# Patient Record
Sex: Female | Born: 1985 | Race: Asian | Hispanic: No | Marital: Married | Smoking: Never smoker
Health system: Southern US, Community
[De-identification: ages and names within clinical notes are randomized; demographics above are authoritative.]

## PROBLEM LIST (undated history)

## (undated) DIAGNOSIS — Z862 Personal history of diseases of the blood and blood-forming organs and certain disorders involving the immune mechanism: Secondary | ICD-10-CM

## (undated) DIAGNOSIS — O00109 Unspecified tubal pregnancy without intrauterine pregnancy: Secondary | ICD-10-CM

## (undated) DIAGNOSIS — Z9889 Other specified postprocedural states: Secondary | ICD-10-CM

## (undated) DIAGNOSIS — Z9049 Acquired absence of other specified parts of digestive tract: Secondary | ICD-10-CM

## (undated) HISTORY — PX: INTRAUTERINE DEVICE (IUD) INSERTION: SHX5877

## (undated) HISTORY — PX: APPENDECTOMY: SHX54

## (undated) HISTORY — DX: Unspecified tubal pregnancy without intrauterine pregnancy: O00.109

## (undated) HISTORY — PX: WISDOM TOOTH EXTRACTION: SHX21

---
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
## 2005-02-12 ENCOUNTER — Emergency Department (HOSPITAL_COMMUNITY): Admission: EM | Admit: 2005-02-12 | Discharge: 2005-02-12 | Payer: Self-pay | Admitting: Family Medicine

## 2007-02-19 ENCOUNTER — Ambulatory Visit: Payer: Self-pay | Admitting: *Deleted

## 2007-02-19 ENCOUNTER — Inpatient Hospital Stay (HOSPITAL_COMMUNITY): Admission: AD | Admit: 2007-02-19 | Discharge: 2007-02-21 | Payer: Self-pay | Admitting: Obstetrics & Gynecology

## 2008-05-08 DIAGNOSIS — Z9889 Other specified postprocedural states: Secondary | ICD-10-CM

## 2008-05-08 HISTORY — DX: Other specified postprocedural states: Z98.890

## 2009-05-08 DIAGNOSIS — O00109 Unspecified tubal pregnancy without intrauterine pregnancy: Secondary | ICD-10-CM

## 2009-05-08 HISTORY — DX: Unspecified tubal pregnancy without intrauterine pregnancy: O00.109

## 2011-02-15 LAB — CCBB MATERNAL DONOR DRAW

## 2011-02-16 LAB — CBC
MCV: 78.5
Platelets: 287
RDW: 14.8 — ABNORMAL HIGH
WBC: 10.2

## 2011-02-16 LAB — HEPATITIS B SURFACE ANTIGEN: Hepatitis B Surface Ag: NEGATIVE

## 2011-02-16 LAB — RPR: RPR Ser Ql: REACTIVE — AB

## 2011-02-16 LAB — RUBELLA SCREEN: Rubella: 51.6 — ABNORMAL HIGH

## 2013-02-05 ENCOUNTER — Encounter: Payer: Self-pay | Admitting: Obstetrics and Gynecology

## 2013-02-19 ENCOUNTER — Encounter: Payer: Self-pay | Admitting: Obstetrics and Gynecology

## 2013-02-19 ENCOUNTER — Encounter: Payer: Self-pay | Admitting: Certified Nurse Midwife

## 2013-02-20 ENCOUNTER — Encounter: Payer: Self-pay | Admitting: Nurse Practitioner

## 2013-02-20 ENCOUNTER — Ambulatory Visit (INDEPENDENT_AMBULATORY_CARE_PROVIDER_SITE_OTHER): Payer: 59 | Admitting: Nurse Practitioner

## 2013-02-20 VITALS — BP 100/58 | HR 60 | Resp 16 | Ht 64.75 in | Wt 147.0 lb

## 2013-02-20 DIAGNOSIS — Z0000 Encounter for general adult medical examination without abnormal findings: Secondary | ICD-10-CM

## 2013-02-20 DIAGNOSIS — Z01419 Encounter for gynecological examination (general) (routine) without abnormal findings: Secondary | ICD-10-CM

## 2013-02-20 DIAGNOSIS — Z975 Presence of (intrauterine) contraceptive device: Secondary | ICD-10-CM

## 2013-02-20 LAB — POCT URINALYSIS DIPSTICK
Glucose, UA: NEGATIVE
Ketones, UA: NEGATIVE
Urobilinogen, UA: NEGATIVE

## 2013-02-20 LAB — HEMOGLOBIN, FINGERSTICK: Hemoglobin, fingerstick: 13.4 g/dL (ref 12.0–16.0)

## 2013-02-20 NOTE — Patient Instructions (Signed)
Preparing for Pregnancy Preparing for pregnancy (preconceptual care) by getting counseling and information from your caregiver before getting pregnant is a good idea. It will help you and your baby have a better chance to have a healthy, safe pregnancy and delivery of your baby. Make an appointment with your caregiver to talk about your health, medical, and family history and how to prepare yourself before getting pregnant. Your caregiver will do a complete physical exam and a Pap test. They will want to know:  About you, your spouse or partner, and your family's medical and genetic history.  If you are eating a balanced diet and drinking enough fluids.  What vitamins and mineral supplements you are taking. This includes taking folic acid before getting pregnant to help prevent birth defects.  What medications you are taking including prescription, over-the-counter and herbal medications.  If there is any substance abuse like alcohol, smoking, and illegal drugs.  If there is any mental or physical domestic violence.  If there is any risk of sexually transmitted disease between you and your partner.  What immunizations and vaccinations you have had and what you may need before getting pregnant.  If you should get tested for HIV infection.  If there is any exposure to chemical or toxic substances at home or work.  If there are medical problems you have that need to be treated and kept under control before getting pregnant such as diabetes, high blood pressure or others.  If there were any past surgeries, pregnancies and problems with them.  What your current weight is and to set a goal as to how much weight you should gain while pregnant. Also, they will check if you should lose or gain weight before getting pregnant.  What is your exercise routine and what it is safe when you are pregnant.  If there are any physical disabilities that need to be addressed.  About spacing your  pregnancies when there are other children.  If there is a financial problem that may affect you having a child. After talking about the above points with your caregiver, your caregiver will give you advice on how to help treat and work with you on solving any issues, if necessary, before getting pregnant. The goal is to have a healthy and safe pregnancy for you and your baby. You should keep an accurate record of your menstrual periods because it will help in determining your due date. Immunizations that you should have before getting pregnant:   Regular measles, German measles (rubella) and mumps.  Tetanus and diphtheria.  Chickenpox, if not immune.  Herpes zoster (Varicella) if not immune.  Human papilloma virus vaccine (HPV) between the age of 9 and 26 years old.  Hepatitis A vaccine.  Hepatitis B vaccine.  Influenza vaccine.  Pneumococcal vaccine (pneumonia). You should avoid getting pregnant for one month after getting vaccinated with a live virus vaccine such as German measles (rubella) vaccine. Other immunizations may be necessary depending on where you live, such as malaria. Ask your caregiver if any other immunizations are needed for you. HOME CARE INSTRUCTIONS   Follow the advice of your caregiver.  Before getting pregnant:  Begin taking vitamins, supplements, and 0.4 milligrams folic acid daily.  Get your immunizations up-to-date.  Get help from a nutrition counselor if you do not understand what a balanced diet is, need help with a special medical diet or if you need help to lose or gain weight.  Begin exercising.  Stop smoking, taking illegal drugs,   and drinking alcoholic beverages.  Get counseling if there is and type of domestic violence.  Get checked for sexually transmitted diseases including HIV.  Get any medical problems under control (diabetes, high blood pressure, convulsions, asthma or others).  Resolve any financial concerns.  Be sure you and  your spouse or partner are ready to have a baby.  Keep an accurate record of your menstrual periods. Document Released: 04/06/2008 Document Revised: 07/17/2011 Document Reviewed: 04/06/2008 [employer] Patient Information 2014 [employer].  

## 2013-02-20 NOTE — Progress Notes (Signed)
Patient ID: Melissa Freeman, female   DOB: 03-01-1986, 27 y.o.   MRN: [id number] 27 y.o. G3P1021 Married Asian Fe here for annual exam.  [facility] [place]  Wants to remove IUD to try for another pregnancy.  Since insertion either no menses or spotting.  Menses normal.  Has 2 pregnancies.  Patient's last menstrual period was 01/29/2013.          Sexually active: yes  The current method of family planning is IUD.   Mirena Exercising: yes  The patient does not participate in regular exercise at present.  Hard to find time with work.  Light workout at home. Smoker:  no  Health Maintenance: Pap:  4-5 years ago, never had abnormal pap TDaP:  ?  Labs: HB: 13.4 Urine: trace RBC, trace WBC, pH 8   reports that she has never smoked. She has never used smokeless tobacco. She reports that she drinks alcohol. She reports that she does not use illicit drugs.  History reviewed. No pertinent past medical history.  Past Surgical History  Procedure Laterality Date  . Appendectomy  age 73    No current outpatient prescriptions on file.   No current facility-administered medications for this visit.    Family History  Problem Relation Age of Onset  . Hyperlipidemia Mother   . Hyperlipidemia Father   . Stroke Maternal Grandfather     ROS:  Pertinent items are noted in HPI.  Otherwise, a comprehensive ROS was negative.  Exam:   BP 100/58  Pulse 60  Resp 16  Ht 5' 4.75" (1.645 m)  Wt 147 lb (66.679 kg)  BMI 24.64 kg/m2  LMP 01/29/2013 Height: 5' 4.75" (164.5 cm)  Ht Readings from Last 3 Encounters:  02/20/13 5' 4.75" (1.645 m)    General appearance: alert, cooperative and appears stated age Head: Normocephalic, without obvious abnormality, atraumatic Neck: no adenopathy, supple, symmetrical, trachea midline and thyroid normal to inspection and palpation Lungs: clear to auscultation bilaterally Breasts: normal appearance, no masses or  tenderness Heart: regular rate and rhythm Abdomen: soft, non-tender; no masses,  no organomegaly Extremities: extremities normal, atraumatic, no cyanosis or edema Skin: Skin color, texture, turgor normal. No rashes or lesions Lymph nodes: Cervical, supraclavicular, and axillary nodes normal. No abnormal inguinal nodes palpated Neurologic: Grossly normal   Pelvic: External genitalia:  no lesions              Urethra:  normal appearing urethra with no masses, tenderness or lesions              Bartholin's and Skene's: normal                 Vagina: normal appearing vagina with normal color and discharge, no lesions              Cervix: anteverted with short IUD strings visible.              Pap taken: yes Bimanual Exam:  Uterus:  normal size, contour, position, consistency, mobility, non-tender              Adnexa: no mass, fullness, tenderness               Rectovaginal: Confirms               Anus:  normal sphincter tone, no lesions  A:  Well Woman with normal exam  Mirena IUD 02/06/11 and wants removal  Planning another pregnancy  P:  Pap smear as per guidelines Done today  Information to insurance for IUD removal  Information is given to restart prenatal MVI now.  With the IUD being removed will use BUM of birth control for 1-2 months for menses to return to normal. She is given basic information about prenatal care and list of safe OTC med's.  Counseled on breast self exam, adequate intake of calcium and vitamin D, diet and exercise return annually or prn  An After Visit Summary was printed and given to the patient.

## 2013-02-21 LAB — IPS PAP TEST WITH REFLEX TO HPV

## 2013-02-22 NOTE — Progress Notes (Signed)
[place] reviewed by Dr. Conley Simmonds.

## 2013-02-25 ENCOUNTER — Telehealth: Payer: Self-pay | Admitting: Nurse Practitioner

## 2013-02-25 NOTE — Telephone Encounter (Signed)
LMTCB to discuss ins benefits and to schedule IUD removal.

## 2013-03-12 ENCOUNTER — Telehealth: Payer: Self-pay | Admitting: Nurse Practitioner

## 2013-03-12 NOTE — Telephone Encounter (Signed)
Advised pap/urine and hgb all wnl. Patient has appt for 11/13 for iud removal.

## 2013-03-12 NOTE — Telephone Encounter (Signed)
Patient calling for recent results please?

## 2013-03-20 ENCOUNTER — Telehealth: Payer: Self-pay | Admitting: Obstetrics and Gynecology

## 2013-03-20 ENCOUNTER — Ambulatory Visit: Payer: 59 | Admitting: Obstetrics and Gynecology

## 2013-03-20 NOTE — Telephone Encounter (Signed)
Spoke with patient to reschedule her appointment that she was 1hour late for  this morning, I offered her today at 12:45   pt was already at work, she was still a little upset and said she would have to think about it. If she decides to reschedule will call back.

## 2013-03-20 NOTE — Telephone Encounter (Signed)
Case reviewed with Patty and Dr. Hyacinth Meeker.  Patient will be charged for [facility] appointment as she came one hour late.

## 2013-03-20 NOTE — Telephone Encounter (Signed)
Yes should get charged fee. --- but if that would cause her to be even more upset and not return then we should not.  It is your judgment call since you spoke to her.

## 2013-03-20 NOTE — Telephone Encounter (Signed)
Pt was an hour late for appointment. Do you want to charge dnka fee?

## 2013-03-20 NOTE — Telephone Encounter (Signed)
Pt came in for procedure but was an hour late. Would like nurse to call and reschedule her appointment.

## 2013-04-30 ENCOUNTER — Telehealth: Payer: Self-pay | Admitting: Obstetrics and Gynecology

## 2013-04-30 NOTE — Telephone Encounter (Signed)
Call to patient.  States she was schededuled for IUD removal one month ago but had to cancel due to menses and she was uncomfortable having it done with cycle.  Would like to reschedule for next week to have removed.  appt scheduled for 05-05-13 at 10 with Dr Edward Jolly. Routing to provider for final review. Patient agreeable to disposition. Will close encounter

## 2013-04-30 NOTE — Telephone Encounter (Signed)
Pt wants to schedule an appointment for IUD removal. °

## 2013-05-05 ENCOUNTER — Ambulatory Visit (INDEPENDENT_AMBULATORY_CARE_PROVIDER_SITE_OTHER): Payer: 59 | Admitting: Obstetrics and Gynecology

## 2013-05-05 ENCOUNTER — Encounter: Payer: Self-pay | Admitting: Obstetrics and Gynecology

## 2013-05-05 VITALS — BP 94/64 | HR 68 | Resp 16 | Ht 64.75 in | Wt 152.0 lb

## 2013-05-05 DIAGNOSIS — Z975 Presence of (intrauterine) contraceptive device: Secondary | ICD-10-CM

## 2013-05-05 DIAGNOSIS — Z3009 Encounter for other general counseling and advice on contraception: Secondary | ICD-10-CM

## 2013-05-05 DIAGNOSIS — Z8349 Family history of other endocrine, nutritional and metabolic diseases: Secondary | ICD-10-CM

## 2013-05-05 DIAGNOSIS — Z30432 Encounter for removal of intrauterine contraceptive device: Secondary | ICD-10-CM

## 2013-05-05 DIAGNOSIS — Z83438 Family history of other disorder of lipoprotein metabolism and other lipidemia: Secondary | ICD-10-CM

## 2013-05-05 NOTE — Patient Instructions (Signed)
Prenatal Care  WHAT IS PRENATAL CARE?  Prenatal care means health care during your pregnancy, before your baby is born. Take care of yourself and your baby by:   Getting early prenatal care. If you know you are pregnant, or think you might be pregnant, call your caregiver as soon as possible. Schedule a visit for a general/prenatal examination.  Getting regular prenatal care. Follow your caregiver's schedule for blood and other necessary tests. Do not miss appointments.  Do everything you can to keep yourself and your baby healthy during your pregnancy.  Prenatal care should include evaluation of medical, dietary, educational, psychological, and social needs for the couple and the medical, surgical, and genetic history of the family of the mother and father.  Discuss with your caregiver:  Your medicines, prescription, over-the-counter, and herbal medicines.  Substance abuse, alcohol, smoking, and illegal drugs.  Domestic abuse and violence, if present.  Your immunizations.  Nutrition and diet.  Exercising.  Environment and occupational hazards, at home and at work.  History of sexually transmitted disease, both you and your partner.  Previous pregnancies. WHY IS PRENATAL CARE SO IMPORTANT?  By seeing you regularly, your caregiver has the chance to find problems early, so that they can be treated as soon as possible. Other problems might be prevented. Many studies have shown that early and regular prenatal care is important for the health of both mothers and their babies.  I AM THINKING ABOUT GETTING PREGNANT. HOW CAN I TAKE CARE OF MYSELF?  Taking care of yourself before you get pregnant helps you to have a healthy pregnancy. It also lowers your chances of having a baby born with a birth defect. Here are ways to take care of yourself before you get pregnant:   Eat healthy foods, exercise regularly (30 minutes per day for most days of the week is best), and get enough rest and  sleep. Talk to your caregiver about what kinds of foods and exercises are best for you.  Take 400 micrograms (mcg) of folic acid (one of the B vitamins) every day. The best way to do this is to take a daily multivitamin pill that contains this amount of folic acid. Getting enough of the synthetic (manufactured) form of folic acid every day before you get pregnant and during early pregnancy can help prevent certain birth defects. Many breakfast cereals and other grain products have folic acid added to them, but only certain cereals contain 400 mcg of folic acid per serving. Check the label on your multivitamin or cereal to find the amount of folic acid in the food.  See your caregiver for a complete check up before getting pregnant. Make sure that you have had all your immunization shots, especially for rubella (German measles). Rubella can cause serious birth defects. Chickenpox is another illness you want to avoid during pregnancy. If you have had chickenpox and rubella in the past, you should be immune to them.  Tell your caregiver about any prescription or non-prescription medicines (including herbal remedies) you are taking. Some medicines are not safe to take during pregnancy.  Stop smoking cigarettes, drinking alcohol, or taking illegal drugs. Ask your caregiver for help, if you need it. You can also get help with alcohol and drugs by talking with a member of your faith community, a counselor, or a trusted friend.  Discuss and treat any medical, social, or psychological problems before getting pregnant.  Discuss any history of genetic problems in the mother, father, and their families. Do   genetic testing before getting pregnant, when possible.  Discuss any physical or emotional abuse with your caregiver.  Discuss with your caregiver if you might be exposed to harmful chemicals on your job or where you live.  Discuss with your caregiver if you think your job or the hours you work may be  harmful and should be changed.  The father should be involved with the decision making and with all aspects of the pregnancy, labor, and delivery.  If you have medical insurance, make sure you are covered for pregnancy. I JUST FOUND OUT THAT I AM PREGNANT. HOW CAN I TAKE CARE OF MYSELF?  Here are ways to take care of yourself and the precious new life growing inside you:   Continue taking your multivitamin with 400 micrograms (mcg) of folic acid every day.  Get early and regular prenatal care. It does not matter if this is your first pregnancy or if you already have children. It is very important to see a caregiver during your pregnancy. Your caregiver will check at each visit to make sure that you and the baby are healthy. If there are any problems, action can be taken right away to help you and the baby.  Eat a healthy diet that includes:  Fruits.  Vegetables.  Foods low in saturated fat.  Grains.  Calcium-rich foods.  Drink 6 to 8 glasses of liquids a day.  Unless your caregiver tells you not to, try to be physically active for 30 minutes, most days of the week. If you are pressed for time, you can get your activity in through 10 minute segments, three times a day.  If you smoke, drink alcohol, or use drugs, STOP. These can cause long-term damage to your baby. Talk with your caregiver about steps to take to stop smoking. Talk with a member of your faith community, a counselor, a trusted friend, or your caregiver if you are concerned about your alcohol or drug use.  Ask your caregiver before taking any medicine, even over-the-counter medicines. Some medicines are not safe to take during pregnancy.  Get plenty of rest and sleep.  Avoid hot tubs and saunas during pregnancy.  Do not have X-rays taken, unless absolutely necessary and with the recommendation of your caregiver. A lead shield can be placed on your abdomen, to protect the baby when X-rays are taken in other parts of the  body.  Do not empty the cat litter when you are pregnant. It may contain a parasite that causes an infection called toxoplasmosis, which can cause birth defects. Also, use gloves when working in garden areas used by cats.  Do not eat uncooked or undercooked cheese, meats, or fish.  Stay away from toxic chemicals like:  Insecticides.  Solvents (some cleaners or paint thinners).  Lead.  Mercury.  Sexual relations may continue until the end of the pregnancy, unless you have a medical problem or there is a problem with the pregnancy and your caregiver tells you not to.  Do not wear high heel shoes, especially during the second half of the pregnancy. You can lose your balance and fall.  Do not take long trips, unless absolutely necessary. Be sure to see your caregiver before going on the trip.  Do not sit in one position for more than 2 hours, when on a trip.  Take a copy of your medical records when going on a trip.  Know where there is a hospital in the city you are visiting, in case of an   emergency.  Most dangerous household products will have pregnancy warnings on their labels. Ask your caregiver about products if you are unsure.  Limit or eliminate your caffeine intake from coffee, tea, sodas, medicines, and chocolate.  Many women continue working through pregnancy. Staying active might help you stay healthier. If you have a question about the safety or the hours you work at your particular job, talk with your caregiver.  Get informed:  Read books.  Watch videos.  Go to childbirth classes for you and the father.  Talk with experienced moms.  Ask your caregiver about childbirth education classes for you and your partner. Classes can help you and your partner prepare for the birth of your baby.  Ask about a pediatrician (baby doctor) and methods and pain medicine for labor, delivery, and possible Cesarean delivery (C-section). I AM NOT THINKING ABOUT GETTING PREGNANT  RIGHT NOW, BUT HEARD THAT ALL WOMEN SHOULD TAKE FOLIC ACID EVERY DAY?  All women of childbearing age, with even a remote chance of getting pregnant, should try to make sure they get enough folic acid. Many pregnancies are not planned. Many women do not know they are actually pregnant early in their pregnancies, and certain birth defects happen in the very early part of pregnancy. Taking 400 micrograms (mcg) of folic acid every day will help prevent certain birth defects that happen in the early part of pregnancy. If a woman begins taking vitamin pills in the second or third month of pregnancy, it may be too late to prevent birth defects. Folic acid may also have other health benefits for women, besides preventing birth defects.  HOW OFTEN SHOULD I SEE MY CAREGIVER DURING PREGNANCY?  Your caregiver will give you a schedule for your prenatal visits. You will have visits more often as you get closer to the end of your pregnancy. An average pregnancy lasts about 40 weeks.  A typical schedule includes visiting your caregiver:   About once each month, during your first 6 months of pregnancy.  Every 2 weeks, during the next 2 months.  Weekly in the last month, until the delivery date. Your caregiver will probably want to see you more often if:  You are over 35.  Your pregnancy is high risk, because you have certain health problems or problems with the pregnancy, such as:  Diabetes.  High blood pressure.  The baby is not growing on schedule, according to the dates of the pregnancy. Your caregiver will do special tests, to make sure you and the baby are not having any serious problems. WHAT HAPPENS DURING PRENATAL VISITS?   At your first prenatal visit, your caregiver will talk to you about you and your partner's health history and your family's health history, and will do a physical exam.  On your first visit, a physical exam will include checks of your blood pressure, height and weight, and an  exam of your pelvic organs. Your caregiver will do a Pap test if you have not had one recently, and will do cultures of your cervix to make sure there is no infection.  At each visit, there will be tests of your blood, urine, blood pressure, weight, and checking the progress of the baby.  Your caregiver will be able to tell you when to expect that your baby will be born.  Each visit is also a chance for you to learn about staying healthy during pregnancy and for asking questions.  Discuss whether you will be breastfeeding.  At your later prenatal   visits, your caregiver will check how you are doing and how the baby is developing. You may have a number of tests done as your pregnancy progresses.  Ultrasound exams are often used to check on the baby's growth and health.  You may have more urine and blood tests, as well as special tests, if needed. These may include amniocentesis (examine fluid in the pregnancy sac), stress tests (check how baby responds to contractions), biophysical profile (measures fetus well-being). Your caregiver will explain the tests and why they are necessary. I AM IN MY LATE THIRTIES, AND I WANT TO HAVE A CHILD NOW. SHOULD I DO ANYTHING SPECIAL?  As you get older, there is more chance of having a medical problem (high blood pressure), pregnancy problem (preeclampsia, problems with the placenta), miscarriage, or a baby born with a birth defect. However, most women in their late thirties and early forties have healthy babies. See your caregiver on a regular basis before you get pregnant and be sure to go for exams throughout your pregnancy. Your caregiver probably will want to do some special tests to check on you and your baby's health when you are pregnant.  Women today are often delaying having children until later in life, when they are in their thirties and forties. While many women in their thirties and forties have no difficulty getting pregnant, fertility does decline  with age. For women over 40 who cannot get pregnant after 6 months of trying, it is recommended that they see their caregiver for a fertility evaluation. It is not uncommon to have trouble becoming pregnant or experience infertility (inability to become pregnant after trying for one year). If you think that you or your partner may be infertile, you can discuss this with your caregiver. He or she can recommend treatments such as drugs, surgery, or assisted reproductive technology.  Document Released: 04/27/2003 Document Revised: 07/17/2011 Document Reviewed: 10/10/2012 [employer] Patient Information 2014 [employer].  

## 2013-05-05 NOTE — Progress Notes (Signed)
Patient ID: Melissa Freeman, female   DOB: 1986/03/21, 27 y.o.   MRN: [id number]  GYNECOLOGY PROBLEM VISIT  PCP:   Referring provider:   HPI: 27 y.o.   Single  Asian  female   [phone] with Patient's last menstrual period was 03/20/2013.   here for  Mirena IUD removal. Mirena IUD placed on 02/05/10 in [place]. Planning on childbearing.   Hs several questions about prior pregnancy loss and ectopic. Has a 22 year old son. No complications.  Had an abdominal skin rash which resoled this. Baby born without problems.  One SAB at 3 months gestation.   Had an ectopic pregnancy following this, treated with methotrexate - these were in [place] at [facility].   Taking vitamins, but not prenatal vitamin.  No prior cholesterol check. Family history of elevated cholesterol.  Did not fast today.   GYNECOLOGIC HISTORY: Patient's last menstrual period was 03/20/2013.    OB History   Grav Para Term Preterm Abortions TAB SAB Ect Mult Living   3 1 1  2  2   1          Family History  Problem Relation Age of Onset  . Hyperlipidemia Mother   . Hyperlipidemia Father   . Stroke Maternal Grandfather   . Hyperlipidemia Brother     There are no active problems to display for this patient.   Past Medical History  Diagnosis Date  . Tubal pregnancy 2011    Past Surgical History  Procedure Laterality Date  . Appendectomy  age 27  . Intrauterine device (iud) insertion      mirena inserted 02-06-11? pt thinks 2011 instead    ALLERGIES: Review of patient's allergies indicates no known allergies.  Current Outpatient Prescriptions  Medication Sig Dispense Refill  . Multiple Vitamins-Minerals (MULTIVITAMIN PO) Take by mouth daily.       No current facility-administered medications for this visit.     ROS:  Pertinent items are noted in HPI.  SOCIAL HISTORY:  Married.   PHYSICAL EXAMINATION:    BP 94/64  Pulse 68  Resp 16  Ht 5' 4.75" (1.645 m)  Wt 152 lb (68.947 kg)  BMI 25.48  kg/m2  LMP 03/20/2013   Wt Readings from Last 3 Encounters:  05/05/13 152 lb (68.947 kg)  02/20/13 147 lb (66.679 kg)     Ht Readings from Last 3 Encounters:  05/05/13 5' 4.75" (1.645 m)  02/20/13 5' 4.75" (1.645 m)    General appearance: alert, cooperative and appears stated age Neurologic: Grossly normal  Pelvic: External genitalia:  no lesions              Urethra:  normal appearing urethra with no masses, tenderness or lesions              Bartholins and Skenes: normal                 Vagina: normal appearing vagina with normal color and discharge, no lesions              Cervix: normal appearance.  IUD string noted.                 Bimanual Exam:  Uterus:  uterus is normal size, shape, consistency and nontender                                      Adnexa: normal adnexa in  size, nontender and no masses                                        Procedure - Mirena IUD removal. Verbal consent for removal. Removed with ring forceps without difficulty.   ASSESSMENT  Desire for future fertility. History of SAB x 1. History of ectopic pregnancy x 1. History of one full term delivery.  Family history of hyperlipidemia.  PLAN  Counseled on prenatal care, pregnancy loss, and ectopic pregnancy. Patient understand she is at risk for a recurrent ectopic and she will come in for early pregnancy care with next pregnancy. Use condoms for the next two months and then may try for pregnancy. Switch to PNV. Return for a fasting lipid profile. Return prn pregnancy or any other concern.  25 minutes face to face time of which over 50% was spent in pregnancy.    An After Visit Summary was printed and given to the patient.

## 2013-10-30 ENCOUNTER — Encounter: Payer: Self-pay | Admitting: Obstetrics and Gynecology

## 2013-10-30 ENCOUNTER — Ambulatory Visit (INDEPENDENT_AMBULATORY_CARE_PROVIDER_SITE_OTHER): Payer: 59

## 2013-10-30 ENCOUNTER — Other Ambulatory Visit: Payer: Self-pay | Admitting: Obstetrics and Gynecology

## 2013-10-30 ENCOUNTER — Ambulatory Visit (INDEPENDENT_AMBULATORY_CARE_PROVIDER_SITE_OTHER): Payer: 59 | Admitting: Obstetrics and Gynecology

## 2013-10-30 ENCOUNTER — Telehealth: Payer: Self-pay | Admitting: Obstetrics and Gynecology

## 2013-10-30 VITALS — BP 120/76 | HR 76 | Resp 16 | Ht 59.0 in | Wt 151.8 lb

## 2013-10-30 DIAGNOSIS — N912 Amenorrhea, unspecified: Secondary | ICD-10-CM

## 2013-10-30 DIAGNOSIS — D239 Other benign neoplasm of skin, unspecified: Secondary | ICD-10-CM

## 2013-10-30 DIAGNOSIS — R229 Localized swelling, mass and lump, unspecified: Secondary | ICD-10-CM

## 2013-10-30 DIAGNOSIS — Z8742 Personal history of other diseases of the female genital tract: Secondary | ICD-10-CM

## 2013-10-30 DIAGNOSIS — Z8759 Personal history of other complications of pregnancy, childbirth and the puerperium: Secondary | ICD-10-CM

## 2013-10-30 DIAGNOSIS — M545 Low back pain, unspecified: Secondary | ICD-10-CM

## 2013-10-30 DIAGNOSIS — D229 Melanocytic nevi, unspecified: Secondary | ICD-10-CM

## 2013-10-30 DIAGNOSIS — R2232 Localized swelling, mass and lump, left upper limb: Secondary | ICD-10-CM

## 2013-10-30 LAB — US OB TRANSVAGINAL

## 2013-10-30 LAB — POCT URINE PREGNANCY: Preg Test, Ur: POSITIVE

## 2013-10-30 NOTE — Progress Notes (Signed)
Patient is scheduled for L Breast US at [facility] for 11/05/13 at 0930, she is given written appointment information time/date/location and is agreeable.

## 2013-10-30 NOTE — Telephone Encounter (Signed)
Spoke with patient. Patient states that she has had a "lump under my left armpit close to my breast" for one to two months. Patient feels that lump is growing in size and is painful to the touch. Patient is unable to describe the size of the lump to me. Patient is pregnant but has not yet established an OBGYN. Patient would like to come in today to be seen. Appointment scheduled for 1:30pm today with Dr.Silva. Patient agreeable to date and time.  Routing to Khaliq Turay for final review. Patient agreeable to disposition. Will close encounter

## 2013-10-30 NOTE — Telephone Encounter (Signed)
Patient found a lump under her armpit. Patient is pregnant and does not have an obgyn yet. Patient would like an appointment.

## 2013-10-30 NOTE — Progress Notes (Signed)
Patient ID: Melissa Freeman, female   DOB: May 13, 1985, 28 y.o.   MRN: [id number] GYNECOLOGY VISIT  PCP:   Referring provider:   HPI: 28 y.o.   Single  Asian  female   [id number] with LMP 09/05/13 here for lump under left arm.   Occurring for two months.  Left side feels like there is a lump.  No excessive activity.  No trauma.  Some breast sensitivity.  No history of breast lumps or procedures.  No family history of breast cancer.   Pt. States has also had a positive home pregnancy test. IUD removed in December.  History of full term delivery, one ectopic, and one SAB. Some back pain but not significant.  No bleeding.   Some nausea and vomiting.  No weight loss.  Difficulty with PNV.   Applying for Medicaid with [facility].   UPT:  positive  GYNECOLOGIC HISTORY: Patient's last menstrual period was 09/07/2013. Sexually active:  yes Partner preference: female Contraception:  none  Menopausal hormone therapy: n/a DES exposure:   no Blood transfusions:  no  Sexually transmitted diseases:   no GYN procedures and prior surgeries:  no Last mammogram: n/a                Last pap and high risk HPV testing: 02-20-13 wnl   History of abnormal pap smear:  no   OB History   Grav Para Term Preterm Abortions TAB SAB Ect Mult Living   3 1 1  2  2   1        LIFESTYLE: Exercise:   no            Tobacco:   no Alcohol:     no Drug use:  no  There are no active problems to display for this patient.   Past Medical History  Diagnosis Date  . Tubal pregnancy 2011    Past Surgical History  Procedure Laterality Date  . Appendectomy  age 16  . Intrauterine device (iud) insertion      mirena inserted 02-06-11? pt thinks 2011 instead    Current Outpatient Prescriptions  Medication Sig Dispense Refill  . Prenatal Vit-Fe Fumarate-FA (MULTIVITAMIN-PRENATAL) 27-0.8 MG TABS tablet Take 1 tablet by mouth daily at 12 noon.       No current facility-administered medications  for this visit.     ALLERGIES: Review of patient's allergies indicates no known allergies.  Family History  Problem Relation Age of Onset  . Hyperlipidemia Mother   . Hyperlipidemia Father   . Stroke Maternal Grandfather   . Hyperlipidemia Brother     History   Social History  . Marital Status: Single    Spouse Name: N/A    Number of Children: N/A  . Years of Education: N/A   Occupational History  . Not on file.   Social History Main Topics  . Smoking status: Never Smoker   . Smokeless tobacco: Never Used  . Alcohol Use: Yes     Comment: occ  . Drug Use: No  . Sexual Activity: Yes    Partners: Male    Birth Control/ Protection: None   Other Topics Concern  . Not on file   Social History Narrative  . No narrative on file    ROS:  Pertinent items are noted in HPI.  PHYSICAL EXAMINATION:    BP 120/76  Pulse 76  Resp 16  Ht 4\' 11"  (1.499 m)  Wt 151 lb 12.8 oz (68.856 kg)  BMI 30.64  kg/m2  LMP 09/07/2013   Wt Readings from Last 3 Encounters:  10/30/13 151 lb 12.8 oz (68.856 kg)  05/05/13 152 lb (68.947 kg)  02/20/13 147 lb (66.679 kg)     Ht Readings from Last 3 Encounters:  10/30/13 4\' 11"  (1.499 m)  05/05/13 5' 4.75" (1.645 m)  02/20/13 5' 4.75" (1.645 m)    General appearance: alert, cooperative and appears stated age Head: Normocephalic, without obvious abnormality, atraumatic Breasts: Inspection negative, No nipple retraction or dimpling, No nipple discharge or bleeding, No axillary or supraclavicular adenopathy, Normal to palpation without dominant masses.  Fullness of the left axillary region without specific mass.  Abdomen: soft, non-tender; no masses,  no organomegaly Skin:  Darkly pigmented nevus of left arm 5 mm.  Pelvic: External genitalia:  no lesions              Urethra:  normal appearing urethra with no masses, tenderness or lesions              Bartholins and Skenes: normal                 Vagina: normal appearing vagina with normal  color and discharge, no lesions              Cervix: normal appearance                 Bimanual Exam:  Uterus:  uterus is 5 week size, consistency and nontender                                      Adnexa: normal adnexa in size, nontender and no masses                                       ASSESSMENT  Left axillary mass.  Feels like accessory left breast tissue.  Early pregnancy.  Back pain.  Size less than dates. History of ectopic pregnancy.  Pigmented nevus of the left arm.   PLAN  [facility] Pelvic ultrasound now to rule out ectopic pregnancy.  Referral to dermatology.  ADDENDUM  Pelvic ultrasound -   Viable IUP.  Size equals dates.  Small right CL cyst.  Patient will establish care with OB office.  List given of provider offices.  Start PNV. Discussed very general prenatal care.  Avoid exposures. Reading suggestions made.        An After Visit Summary was printed and given to the patient.  40 minutes face to face time of which over 50% was spent in counseling.

## 2013-10-30 NOTE — Patient Instructions (Signed)
Try vitamin B6 and Unisom for pregnancy related nausea.  For the vit B 6, you can take this is as a 25 mg or try a sucker with this vitamin in it.  For Unisom, take 1/2 tablet for nausea.

## 2013-10-31 ENCOUNTER — Telehealth: Payer: Self-pay | Admitting: Obstetrics and Gynecology

## 2013-10-31 NOTE — Telephone Encounter (Signed)
Please call "Pam" at [facility]".

## 2013-11-03 NOTE — Telephone Encounter (Signed)
Please let the patient know this information.   I recommend that she pursue her dermatology care after she is seen at the [facility] office.  They can help to coordinate her care elsewhere.

## 2013-11-03 NOTE — Telephone Encounter (Signed)
Pam from [facility] called regarding "new patient" referral. She states that this patient was originally scheduled 05.26.2015, she did not show for this appointment0. She states that they do not rescheduled "new patient" appointments once they [facility].

## 2013-11-03 NOTE — Telephone Encounter (Signed)
Left message for Pam to call me back.

## 2013-11-05 ENCOUNTER — Other Ambulatory Visit: Payer: Self-pay

## 2013-11-06 NOTE — Telephone Encounter (Signed)
Spoke with patient. Advised that per Pam from [facility], they will not reschedule her because she did not show for a new patient referral appointment.  Patient states that she "totally forgot about that appointment".  I also advised patient that is it Dr Elza Rafter recommendation that once she is seen at the [facility] office, they can coordinate a referral elsewhere for her.  Patient agreeable.

## 2013-12-09 ENCOUNTER — Emergency Department (HOSPITAL_COMMUNITY)
Admission: EM | Admit: 2013-12-09 | Discharge: 2013-12-09 | Disposition: A | Discharge status: Home / Self Care | Payer: Medicaid Other | Attending: Emergency Medicine | Admitting: Emergency Medicine

## 2013-12-09 ENCOUNTER — Encounter (HOSPITAL_COMMUNITY): Payer: Self-pay | Admitting: Emergency Medicine

## 2013-12-09 ENCOUNTER — Emergency Department (HOSPITAL_COMMUNITY): Payer: Medicaid Other

## 2013-12-09 DIAGNOSIS — B349 Viral infection, unspecified: Secondary | ICD-10-CM

## 2013-12-09 DIAGNOSIS — O3100X0 Papyraceous fetus, unspecified trimester, not applicable or unspecified: Secondary | ICD-10-CM | POA: Insufficient documentation

## 2013-12-09 DIAGNOSIS — N390 Urinary tract infection, site not specified: Secondary | ICD-10-CM | POA: Insufficient documentation

## 2013-12-09 DIAGNOSIS — M549 Dorsalgia, unspecified: Secondary | ICD-10-CM | POA: Insufficient documentation

## 2013-12-09 DIAGNOSIS — Z3490 Encounter for supervision of normal pregnancy, unspecified, unspecified trimester: Secondary | ICD-10-CM

## 2013-12-09 DIAGNOSIS — O9989 Other specified diseases and conditions complicating pregnancy, childbirth and the puerperium: Secondary | ICD-10-CM | POA: Insufficient documentation

## 2013-12-09 DIAGNOSIS — Z79899 Other long term (current) drug therapy: Secondary | ICD-10-CM | POA: Insufficient documentation

## 2013-12-09 DIAGNOSIS — O2390 Unspecified genitourinary tract infection in pregnancy, unspecified trimester: Secondary | ICD-10-CM | POA: Insufficient documentation

## 2013-12-09 DIAGNOSIS — O98519 Other viral diseases complicating pregnancy, unspecified trimester: Secondary | ICD-10-CM | POA: Insufficient documentation

## 2013-12-09 LAB — URINE MICROSCOPIC-ADD ON

## 2013-12-09 LAB — COMPREHENSIVE METABOLIC PANEL
ALK PHOS: 60 U/L (ref 39–117)
ALT: 21 U/L (ref 0–35)
AST: 17 U/L (ref 0–37)
Albumin: 3.4 g/dL — ABNORMAL LOW (ref 3.5–5.2)
Anion gap: 14 (ref 5–15)
BUN: 7 mg/dL (ref 6–23)
CO2: 23 meq/L (ref 19–32)
Calcium: 9.4 mg/dL (ref 8.4–10.5)
Chloride: 100 mEq/L (ref 96–112)
Creatinine, Ser: 0.57 mg/dL (ref 0.50–1.10)
GLUCOSE: 97 mg/dL (ref 70–99)
POTASSIUM: 3.7 meq/L (ref 3.7–5.3)
SODIUM: 137 meq/L (ref 137–147)
TOTAL PROTEIN: 7.6 g/dL (ref 6.0–8.3)
Total Bilirubin: 0.3 mg/dL (ref 0.3–1.2)

## 2013-12-09 LAB — OB RESULTS CONSOLE GC/CHLAMYDIA
Chlamydia: NEGATIVE
GC PROBE AMP, GENITAL: NEGATIVE

## 2013-12-09 LAB — CBC WITH DIFFERENTIAL/PLATELET
Basophils Absolute: 0 10*3/uL (ref 0.0–0.1)
Basophils Relative: 0 % (ref 0–1)
Eosinophils Absolute: 0.2 10*3/uL (ref 0.0–0.7)
Eosinophils Relative: 3 % (ref 0–5)
HCT: 38 % (ref 36.0–46.0)
Hemoglobin: 12.6 g/dL (ref 12.0–15.0)
LYMPHS ABS: 1.7 10*3/uL (ref 0.7–4.0)
LYMPHS PCT: 22 % (ref 12–46)
MCH: 25.5 pg — ABNORMAL LOW (ref 26.0–34.0)
MCHC: 33.2 g/dL (ref 30.0–36.0)
MCV: 76.9 fL — ABNORMAL LOW (ref 78.0–100.0)
Monocytes Absolute: 0.4 10*3/uL (ref 0.1–1.0)
Monocytes Relative: 6 % (ref 3–12)
NEUTROS PCT: 69 % (ref 43–77)
Neutro Abs: 5.4 10*3/uL (ref 1.7–7.7)
PLATELETS: 230 10*3/uL (ref 150–400)
RBC: 4.94 MIL/uL (ref 3.87–5.11)
RDW: 13.2 % (ref 11.5–15.5)
WBC: 7.7 10*3/uL (ref 4.0–10.5)

## 2013-12-09 LAB — URINALYSIS, ROUTINE W REFLEX MICROSCOPIC
Bilirubin Urine: NEGATIVE
GLUCOSE, UA: NEGATIVE mg/dL
Ketones, ur: 15 mg/dL — AB
Nitrite: POSITIVE — AB
PROTEIN: 30 mg/dL — AB
Specific Gravity, Urine: 1.019 (ref 1.005–1.030)
Urobilinogen, UA: 1 mg/dL (ref 0.0–1.0)
pH: 5.5 (ref 5.0–8.0)

## 2013-12-09 LAB — WET PREP, GENITAL
CLUE CELLS WET PREP: NONE SEEN
TRICH WET PREP: NONE SEEN
Yeast Wet Prep HPF POC: NONE SEEN

## 2013-12-09 LAB — HCG, QUANTITATIVE, PREGNANCY: HCG, BETA CHAIN, QUANT, S: 33562 m[IU]/mL — AB (ref <5)

## 2013-12-09 LAB — LIPASE, BLOOD: Lipase: 40 U/L (ref 11–59)

## 2013-12-09 MED ORDER — ACETAMINOPHEN 500 MG PO TABS: 500.0000 mg | ORAL_TABLET | Freq: Four times a day (QID) | ORAL | Status: DC | PRN

## 2013-12-09 MED ORDER — CEPHALEXIN 500 MG PO CAPS: 500.0000 mg | ORAL_CAPSULE | Freq: Four times a day (QID) | ORAL | Status: DC

## 2013-12-09 MED ORDER — ONDANSETRON 4 MG PO TBDP
4.0000 mg | ORAL_TABLET | Freq: Once | ORAL | Status: AC
  Administered 2013-12-09: 4 mg via ORAL
  Filled 2013-12-09: qty 1

## 2013-12-09 MED ORDER — ONDANSETRON 4 MG PO TBDP: 4.0000 mg | ORAL_TABLET | Freq: Three times a day (TID) | ORAL | Status: DC | PRN

## 2013-12-09 NOTE — ED Provider Notes (Signed)
Medical screening examination/treatment/procedure(s) were performed by non-physician practitioner and as supervising physician I was immediately available for consultation/collaboration.   EKG Interpretation None        Houston Siren III, MD 12/09/13 [phone]

## 2013-12-09 NOTE — Discharge Instructions (Signed)
Take Keflex as directed until gone for your UTI. Take zofran as needed for nausea. Take tylenol as needed for pain. Follow up with your OBGYN. Go to [facility] with worsening or concerning symptoms.

## 2013-12-09 NOTE — ED Provider Notes (Signed)
CSN: [id number]     Arrival date & time 12/09/13  0610 History   First MD Initiated Contact with Patient 12/09/13 0710     Chief Complaint  Patient presents with  . Abdominal Pain     (Consider location/radiation/quality/duration/timing/severity/associated sxs/prior Treatment) HPI Comments: Patient is a G39P1 female who is 13w pregnant who presents with abdominal pain since last night. The pain is located in her upper abdomen and does not radiate. The pain is described as aching and severe. The pain started gradually and progressively worsened since the onset. No alleviating/aggravating factors. The patient has tried nothing for symptoms without relief. Associated symptoms include lower back pain, nausea, vomiting, and diarrhea. Patient denies fever, headache, chest pain, SOB, dysuria, constipation, abnormal vaginal bleeding/discharge.      Past Medical History  Diagnosis Date  . Tubal pregnancy 2011   Past Surgical History  Procedure Laterality Date  . Appendectomy  age 39  . Intrauterine device (iud) insertion      mirena inserted 02-06-11? pt thinks 2011 instead   Family History  Problem Relation Age of Onset  . Hyperlipidemia Mother   . Hyperlipidemia Father   . Stroke Maternal Grandfather   . Hyperlipidemia Brother    History  Substance Use Topics  . Smoking status: Never Smoker   . Smokeless tobacco: Never Used  . Alcohol Use: Yes     Comment: occ   OB History   Grav Para Term Preterm Abortions TAB SAB Ect Mult Living   4 1 1  2  2   1      Review of Systems  Constitutional: Negative for fever, chills and fatigue.  HENT: Negative for trouble swallowing.   Eyes: Negative for visual disturbance.  Respiratory: Negative for shortness of breath.   Cardiovascular: Negative for chest pain and palpitations.  Gastrointestinal: Positive for nausea, vomiting, abdominal pain and diarrhea.  Genitourinary: Negative for dysuria and difficulty urinating.  Musculoskeletal:  Positive for back pain. Negative for arthralgias and neck pain.  Skin: Negative for color change.  Neurological: Negative for dizziness and weakness.  Psychiatric/Behavioral: Negative for dysphoric mood.      Allergies  Review of patient's allergies indicates no known allergies.  Home Medications   Prior to Admission medications   Medication Sig Start Date End Date Taking? Authorizing Provider  Prenatal Vit-Fe Fumarate-FA (MULTIVITAMIN-PRENATAL) 27-0.8 MG TABS tablet Take 1 tablet by mouth daily at 12 noon.   Yes Historical Provider, MD   BP 104/69  Pulse 78  Temp(Src) 97.9 F (36.6 C) (Oral)  Resp 18  Ht 5\' 5"  (1.651 m)  Wt 144 lb 4.8 oz (65.454 kg)  BMI 24.01 kg/m2  SpO2 96%  LMP 09/05/2013 Physical Exam  Nursing note and vitals reviewed. Constitutional: She is oriented to person, place, and time. She appears well-developed and well-nourished. No distress.  HENT:  Head: Normocephalic and atraumatic.  Eyes: Conjunctivae and EOM are normal.  Neck: Normal range of motion.  Cardiovascular: Normal rate and regular rhythm.  Exam reveals no gallop and no friction rub.   No murmur heard. Pulmonary/Chest: Effort normal and breath sounds normal. She has no wheezes. She has no rales. She exhibits no tenderness.  Abdominal: Soft. She exhibits no distension. There is tenderness. There is no rebound and no guarding.  Epigastric tenderness to palpation. No focal tenderness to palpation. No lower abdominal tenderness to palpation.   Genitourinary: Vagina normal.  Normal external genitalia. Copious, thick white vaginal discharge. Cervical os closed. No CMT. No tenderness  to palpation on bimanual exam.   Musculoskeletal: Normal range of motion.  Neurological: She is alert and oriented to person, place, and time. Coordination normal.  Speech is goal-oriented. Moves limbs without ataxia.   Skin: Skin is warm and dry.  Psychiatric: She has a normal mood and affect. Her behavior is normal.     ED Course  Procedures (including critical care time) Labs Review Labs Reviewed  WET PREP, GENITAL - Abnormal; Notable for the following:    WBC, Wet Prep HPF POC TOO NUMEROUS TO COUNT (*)    All other components within normal limits  CBC WITH DIFFERENTIAL - Abnormal; Notable for the following:    MCV 76.9 (*)    MCH 25.5 (*)    All other components within normal limits  COMPREHENSIVE METABOLIC PANEL - Abnormal; Notable for the following:    Albumin 3.4 (*)    All other components within normal limits  URINALYSIS, ROUTINE W REFLEX MICROSCOPIC - Abnormal; Notable for the following:    APPearance CLOUDY (*)    Hgb urine dipstick MODERATE (*)    Ketones, ur 15 (*)    Protein, ur 30 (*)    Nitrite POSITIVE (*)    Leukocytes, UA MODERATE (*)    All other components within normal limits  HCG, QUANTITATIVE, PREGNANCY - Abnormal; Notable for the following:    hCG, Beta Chain, Quant, S [id number] (*)    All other components within normal limits  URINE MICROSCOPIC-ADD ON - Abnormal; Notable for the following:    Squamous Epithelial / LPF FEW (*)    Bacteria, UA MANY (*)    All other components within normal limits  GC/CHLAMYDIA PROBE AMP  LIPASE, BLOOD    Imaging Review US Ob Comp Less 14 Wks  12/09/2013   CLINICAL DATA:  Pain.  EXAM: OBSTETRIC <14 WK ULTRASOUND  TECHNIQUE: Transabdominal ultrasound was performed for evaluation of the gestation as well as the maternal uterus and adnexal regions.  COMPARISON:  None.  FINDINGS: Intrauterine gestational sac: Visualized/normal in shape.  Yolk sac:  Not visualized.  Embryo:  Visualized.  Cardiac Activity: Visualized.  Heart Rate: 173 bpm  CRL:   79  Mm   13 w 6 d                  [place] EDC: 06/30/2014  Maternal uterus/adnexae:  Questionable placenta previa noted.  IMPRESSION: 1. Single viable intrauterine pregnancy at 13 weeks 6 days. 2. Questionable placenta previa. This should be followed on subsequent obstetrical ultrasounds.   Electronically  Signed   By: Marcello Moores  Register   On: 12/09/2013 09:24     EKG Interpretation None      MDM   Final diagnoses:  Viral illness  UTI (lower urinary tract infection)  Pregnancy    7:48 AM Patient's labs unremarkable for acute changes. Urinalysis pending. Vitals stable and patient afebrile. Patient does not want an IV. Patient will have zofran ODT and tylenol for pain.   10:24 AM Patient's urinalysis shows UTI. US shows questionable placenta previa. Patient instructed to follow up with OBGYN. Patient will have Keflex for UTI, zofran for nausea, and tylenol for pain. Vitals stable and patient afebrile. No further evaluation needed at this time. Patient instructed to go to [facility] with worsening or concerning symptoms.    Alvina Chou, PA-C 12/09/13 1030

## 2013-12-09 NOTE — ED Notes (Signed)
Pt. woke up this morning with upper/lower abdominal pain , lower back pain , emesis and diarrhea . Pt. stated she is [redacted] weeks pregnant ( G3P1) , denies vaginal spotting or discharge . No fever or chills.

## 2013-12-10 LAB — GC/CHLAMYDIA PROBE AMP
CT Probe RNA: NEGATIVE
GC Probe RNA: NEGATIVE

## 2014-01-20 LAB — OB RESULTS CONSOLE HEPATITIS B SURFACE ANTIGEN: Hepatitis B Surface Ag: NEGATIVE

## 2014-01-20 LAB — OB RESULTS CONSOLE ABO/RH: RH Type: POSITIVE

## 2014-01-20 LAB — OB RESULTS CONSOLE RUBELLA ANTIBODY, IGM: RUBELLA: IMMUNE

## 2014-01-20 LAB — OB RESULTS CONSOLE RPR: RPR: NONREACTIVE

## 2014-01-20 LAB — OB RESULTS CONSOLE HIV ANTIBODY (ROUTINE TESTING): HIV: NONREACTIVE

## 2014-02-09 ENCOUNTER — Encounter: Payer: Self-pay | Admitting: Emergency Medicine

## 2014-02-09 ENCOUNTER — Telehealth: Payer: Self-pay | Admitting: Emergency Medicine

## 2014-02-09 NOTE — Telephone Encounter (Signed)
Message copied by Michele Mcalpine on Mon Feb 09, 2014  5:03 PM ------      Message from: [place], [place]      Created: Mon Feb 09, 2014 12:10 PM      Regarding: RE: Mammogram hold        Send certified letter to patient and then after receipt comes back, ok to remove from mammogram hold.            Thanks.             ----- Message -----         From: Karen Chafe, RN         Sent: 02/09/2014  10:14 AM           To: Brook E Amundson de Berton Lan, MD      Subject: Mammogram hold                                           Dr. Quincy Simmonds,             This patient is in Mammogram hold but has not completed images.       She has transferred to [facility] care.       Out of hold or Letter?       ------

## 2014-02-13 ENCOUNTER — Encounter: Payer: Self-pay | Admitting: Emergency Medicine

## 2014-02-23 ENCOUNTER — Ambulatory Visit: Payer: 59 | Admitting: Nurse Practitioner

## 2014-03-09 ENCOUNTER — Encounter (HOSPITAL_COMMUNITY): Payer: Self-pay | Admitting: Emergency Medicine

## 2014-04-15 ENCOUNTER — Telehealth: Payer: Self-pay | Admitting: Emergency Medicine

## 2014-04-15 NOTE — Telephone Encounter (Signed)
-----   Message from [place], MD sent at 02/09/2014 12:10 PM EDT ----- Regarding: RE: Mammogram hold  Send certified letter to patient and then after receipt comes back, ok to remove from mammogram hold.  Thanks.   ----- Message -----    From: Karen Chafe, RN    Sent: 02/09/2014  10:14 AM      To: Brook E Amundson de Berton Lan, MD Subject: Mammogram hold                                 Dr. Quincy Simmonds,   This patient is in Mammogram hold but has not completed images.  She has transferred to [facility] care.  Out of hold or Letter?

## 2014-04-15 NOTE — Telephone Encounter (Signed)
Letter sent and receipt scanned. Removed from Mammogram hold.

## 2014-05-08 NOTE — L&D Delivery Note (Signed)
Vaginal Delivery Note The pt utilized an epidural as pain management.   Artificial rupture of membranes today, at 0644, clear.  GBS was negative.  Cervical dilation was complete at  [phone].  NICHD Category 1.    Pushing with guidance began at  1010.   After 18 minutes of pushing the head, shoulders and the body of a viable female infant "Kenan" delivered spontaneously with maternal effort in the ROA position at 1028   With vigorous tone and spontaneous cry, the infant was placed on moms abd.  After the umbilical cord was clamped it was cut by the FOB, then cord blood was obtained for evaluation.  Spontaneous delivery of a intact placenta with a 3 vessel cord via [place] at  1035.   Episiotomy: None   The vulva, perineum, vaginal vault, rectum and cervix were inspected and revealed a 2 degree vaginal which was repair using a 3-0 vicryl on a CT needle and left labial laceration which was repaired using a 4-0 vicryl on a SH.  Lidocaine was not used, the epidural was sufficient for the repair.    Postpartum pitocin as ordered.  Fundus firm, lochia minimum, bleeding under control. EBL 100, Pt hemodynamically stable.   Sponge, laps and needle count correct and verified with the primary care nurse.  Attending MD available at all times.    Mom and baby were left in stable condition, baby skin to skin. Routine postpartum orders   Mother desires NFP for contraception   Infant will not have a circumcision   Placenta to pathology: NO     Cord Gases sent to lab: NO Cord blood sent to lab: YES   APGARS:  9 at 1 minute and 9 at 5 minutes Weight:. 9lb 2.6oz      Lanay Zinda, CNM, MSN 06/10/2014. 8:48 AM

## 2014-05-20 LAB — OB RESULTS CONSOLE GBS: GBS: NEGATIVE

## 2014-05-27 ENCOUNTER — Encounter (HOSPITAL_COMMUNITY): Payer: Self-pay

## 2014-05-27 ENCOUNTER — Inpatient Hospital Stay (HOSPITAL_COMMUNITY)
Admission: AD | Admit: 2014-05-27 | Discharge: 2014-05-27 | Disposition: A | Discharge status: Home / Self Care | Payer: Medicaid Other | Source: Ambulatory Visit | Attending: Obstetrics and Gynecology | Admitting: Obstetrics and Gynecology

## 2014-05-27 DIAGNOSIS — Z3A37 37 weeks gestation of pregnancy: Secondary | ICD-10-CM | POA: Insufficient documentation

## 2014-05-27 DIAGNOSIS — A530 Latent syphilis, unspecified as early or late: Secondary | ICD-10-CM

## 2014-05-27 DIAGNOSIS — O471 False labor at or after 37 completed weeks of gestation: Secondary | ICD-10-CM | POA: Diagnosis not present

## 2014-05-27 DIAGNOSIS — N390 Urinary tract infection, site not specified: Secondary | ICD-10-CM

## 2014-05-27 NOTE — Discharge Instructions (Signed)
Braxton Hicks Contractions °Contractions of the uterus can occur throughout pregnancy. Contractions are not always a sign that you are in labor.  °WHAT ARE BRAXTON HICKS CONTRACTIONS?  °Contractions that occur before labor are called Braxton Hicks contractions, or false labor. Toward the end of pregnancy (32-34 weeks), these contractions can develop more often and may become more forceful. This is not true labor because these contractions do not result in opening (dilatation) and thinning of the cervix. They are sometimes difficult to tell apart from true labor because these contractions can be forceful and people have different pain tolerances. You should not feel embarrassed if you go to the hospital with false labor. Sometimes, the only way to tell if you are in true labor is for your health care provider to look for changes in the cervix. °If there are no prenatal problems or other health problems associated with the pregnancy, it is completely safe to be sent home with false labor and await the onset of true labor. °HOW CAN YOU TELL THE DIFFERENCE BETWEEN TRUE AND FALSE LABOR? °False Labor °· The contractions of false labor are usually shorter and not as hard as those of true labor.   °· The contractions are usually irregular.   °· The contractions are often felt in the front of the lower abdomen and in the groin.   °· The contractions may go away when you walk around or change positions while lying down.   °· The contractions get weaker and are shorter lasting as time goes on.   °· The contractions do not usually become progressively stronger, regular, and closer together as with true labor.   °True Labor °· Contractions in true labor last 30-70 seconds, become very regular, usually become more intense, and increase in frequency.   °· The contractions do not go away with walking.   °· The discomfort is usually felt in the top of the uterus and spreads to the lower abdomen and low back.   °· True labor can be  determined by your health care provider with an exam. This will show that the cervix is dilating and getting thinner.   °WHAT TO REMEMBER °· Keep up with your usual exercises and follow other instructions given by your health care provider.   °· Take medicines as directed by your health care provider.   °· Keep your regular prenatal appointments.   °· Eat and drink lightly if you think you are going into labor.   °· If Braxton Hicks contractions are making you uncomfortable:   °¨ Change your position from lying down or resting to walking, or from walking to resting.   °¨ Sit and rest in a tub of warm water.   °¨ Drink 2-3 glasses of water. Dehydration may cause these contractions.   °¨ Do slow and deep breathing several times an hour.   °WHEN SHOULD I SEEK IMMEDIATE MEDICAL CARE? °Seek immediate medical care if: °· Your contractions become stronger, more regular, and closer together.   °· You have fluid leaking or gushing from your vagina.   °· You have a fever.   °· You pass blood-tinged mucus.   °· You have vaginal bleeding.   °· You have continuous abdominal pain.   °· You have low back pain that you never had before.   °· You feel your baby's head pushing down and causing pelvic pressure.   °· Your baby is not moving as much as it used to.   °Document Released: 04/24/2005 Document Revised: 04/29/2013 Document Reviewed: 02/03/2013 °ExitCare® Patient Information ©2015 [employer]. This information is not intended to replace advice given to you by your health care   provider. Make sure you discuss any questions you have with your health care provider. ° °

## 2014-05-27 NOTE — MAU Note (Signed)
Pt presents complaining of contractions that are irregular. Is not timing them. Denies vaginal bleeding or discharge or leaking of fluid. Reports good fetal movement.

## 2014-05-27 NOTE — MAU Provider Note (Signed)
History  Melissa Freeman is a 29 yo G4P1021 @ 37.5 wks by sure LMP, c/w 13.6 wk scan presents unannounced to MAU w/ c/o irregular ctxs since about 2 pm today. Ctxs worse yesterday in terms of intensity and regularity due to "overdoing it" and less intense/frequent today, however since her last baby was in 2008, she was not sure if she was in labor. Reports active fetus. Denies VB or LOF. Dx'd and treated for UTI on 1/11/6; states has one pill left. Was also treated for yeast vaginitis that same day; sxs resolved.   Pt is hoping to not be in labor at this time; states mainly here for reassurance.    Patient Active Problem List   Diagnosis Date Noted  . VDRL positive on 01/20/14; confirmatory test negative 05/27/2014  . UTI (urinary tract infection) on 05/18/14 05/27/2014  . False labor after 37 weeks of gestation without delivery 05/27/2014    Chief Complaint  Patient presents with  . Labor Eval   HPI As above OB History    Gravida Para Term Preterm AB TAB SAB Ectopic Multiple Living   4 1 1  2  2   1       Past Medical History  Diagnosis Date  . Tubal pregnancy 2011    Past Surgical History  Procedure Laterality Date  . Appendectomy  age 69  . Intrauterine device (iud) insertion      mirena inserted 02-06-11? pt thinks 2011 instead    Family History  Problem Relation Age of Onset  . Hyperlipidemia Mother   . Hyperlipidemia Father   . Stroke Maternal Grandfather   . Hyperlipidemia Brother     History  Substance Use Topics  . Smoking status: Never Smoker   . Smokeless tobacco: Never Used  . Alcohol Use: Yes     Comment: occ    Allergies: No Known Allergies  No prescriptions prior to admission    ROS  Irregular ctxs +FM Physical Exam  Gen: NAD Abdomen: gravid, soft, NT, no guarding or rebound Pelvic: 3/50/-3, +discharge Ext: WNL FHRT: BL 140 w/ moderate variability, abundant accels, no decels Ctxs: Rare  Blood pressure 117/64, pulse 107, temperature  97.8 F (36.6 C), temperature source Oral, resp. rate 16, last menstrual period 09/05/2013.    Physical Exam  ED Course  Assessment: Not in labor Cat 1 FHRT  Plan: D/C home w/ strict labor precautions FKCs reviewed OB f/u as previously scheduled   Farrel Gordon CNM, MS 05/27/2014 9:17 PM

## 2014-06-10 ENCOUNTER — Inpatient Hospital Stay (HOSPITAL_COMMUNITY): Payer: Medicaid Other | Admitting: Anesthesiology

## 2014-06-10 ENCOUNTER — Encounter (HOSPITAL_COMMUNITY): Payer: Self-pay | Admitting: *Deleted

## 2014-06-10 ENCOUNTER — Inpatient Hospital Stay (HOSPITAL_COMMUNITY)
Admission: AD | Admit: 2014-06-10 | Discharge: 2014-06-12 | DRG: 775 | Disposition: A | Discharge status: Home / Self Care | Payer: Medicaid Other | Source: Ambulatory Visit | Attending: Obstetrics & Gynecology | Admitting: Obstetrics & Gynecology

## 2014-06-10 DIAGNOSIS — Z3A39 39 weeks gestation of pregnancy: Secondary | ICD-10-CM | POA: Diagnosis present

## 2014-06-10 DIAGNOSIS — O0933 Supervision of pregnancy with insufficient antenatal care, third trimester: Secondary | ICD-10-CM

## 2014-06-10 DIAGNOSIS — O0930 Supervision of pregnancy with insufficient antenatal care, unspecified trimester: Secondary | ICD-10-CM

## 2014-06-10 DIAGNOSIS — Z823 Family history of stroke: Secondary | ICD-10-CM | POA: Diagnosis not present

## 2014-06-10 HISTORY — DX: Acquired absence of other specified parts of digestive tract: Z90.49

## 2014-06-10 HISTORY — DX: Other specified postprocedural states: Z98.890

## 2014-06-10 HISTORY — DX: Personal history of diseases of the blood and blood-forming organs and certain disorders involving the immune mechanism: Z86.2

## 2014-06-10 LAB — CBC
HCT: 38.4 % (ref 36.0–46.0)
Hemoglobin: 12.5 g/dL (ref 12.0–15.0)
MCH: 26.1 pg (ref 26.0–34.0)
MCHC: 32.6 g/dL (ref 30.0–36.0)
MCV: 80.2 fL (ref 78.0–100.0)
PLATELETS: 247 10*3/uL (ref 150–400)
RBC: 4.79 MIL/uL (ref 3.87–5.11)
RDW: 14.4 % (ref 11.5–15.5)
WBC: 8 10*3/uL (ref 4.0–10.5)

## 2014-06-10 LAB — TYPE AND SCREEN
ABO/RH(D): B POS
ANTIBODY SCREEN: NEGATIVE

## 2014-06-10 LAB — ABO/RH: ABO/RH(D): B POS

## 2014-06-10 MED ORDER — OXYTOCIN 40 UNITS IN LACTATED RINGERS INFUSION - SIMPLE MED
62.5000 mL/h | INTRAVENOUS | Status: DC
  Filled 2014-06-10: qty 1000

## 2014-06-10 MED ORDER — FLEET ENEMA 7-19 GM/118ML RE ENEM: 1.0000 | ENEMA | RECTAL | Status: DC | PRN

## 2014-06-10 MED ORDER — OXYTOCIN BOLUS FROM INFUSION: 500.0000 mL | INTRAVENOUS | Status: DC

## 2014-06-10 MED ORDER — LACTATED RINGERS IV SOLN: 500.0000 mL | INTRAVENOUS | Status: DC | PRN

## 2014-06-10 MED ORDER — ACETAMINOPHEN 325 MG PO TABS: 650.0000 mg | ORAL_TABLET | ORAL | Status: DC | PRN

## 2014-06-10 MED ORDER — PRENATAL MULTIVITAMIN CH
1.0000 | ORAL_TABLET | Freq: Every day | ORAL | Status: DC
  Administered 2014-06-10 – 2014-06-12 (×3): 1 via ORAL
  Filled 2014-06-10 (×3): qty 1

## 2014-06-10 MED ORDER — LACTATED RINGERS IV SOLN
INTRAVENOUS | Status: DC
  Administered 2014-06-10: 10:00:00 via INTRAVENOUS

## 2014-06-10 MED ORDER — IBUPROFEN 600 MG PO TABS
600.0000 mg | ORAL_TABLET | Freq: Four times a day (QID) | ORAL | Status: DC
  Administered 2014-06-10 – 2014-06-12 (×8): 600 mg via ORAL
  Filled 2014-06-10 (×9): qty 1

## 2014-06-10 MED ORDER — DIBUCAINE 1 % RE OINT
1.0000 "application " | TOPICAL_OINTMENT | RECTAL | Status: DC | PRN
  Administered 2014-06-11: 1 via RECTAL
  Filled 2014-06-10: qty 28

## 2014-06-10 MED ORDER — PHENYLEPHRINE 40 MCG/ML (10ML) SYRINGE FOR IV PUSH (FOR BLOOD PRESSURE SUPPORT)
80.0000 ug | PREFILLED_SYRINGE | INTRAVENOUS | Status: DC | PRN
  Filled 2014-06-10: qty 2

## 2014-06-10 MED ORDER — ONDANSETRON HCL 4 MG PO TABS: 4.0000 mg | ORAL_TABLET | ORAL | Status: DC | PRN

## 2014-06-10 MED ORDER — OXYTOCIN 40 UNITS IN LACTATED RINGERS INFUSION - SIMPLE MED: 1.0000 m[IU]/min | INTRAVENOUS | Status: DC

## 2014-06-10 MED ORDER — TERBUTALINE SULFATE 1 MG/ML IJ SOLN
0.2500 mg | Freq: Once | INTRAMUSCULAR | Status: DC | PRN
  Filled 2014-06-10: qty 1

## 2014-06-10 MED ORDER — DIPHENHYDRAMINE HCL 25 MG PO CAPS: 25.0000 mg | ORAL_CAPSULE | Freq: Four times a day (QID) | ORAL | Status: DC | PRN

## 2014-06-10 MED ORDER — OXYCODONE-ACETAMINOPHEN 5-325 MG PO TABS: 1.0000 | ORAL_TABLET | ORAL | Status: DC | PRN

## 2014-06-10 MED ORDER — LIDOCAINE HCL (PF) 1 % IJ SOLN
30.0000 mL | INTRAMUSCULAR | Status: DC | PRN
  Filled 2014-06-10: qty 30

## 2014-06-10 MED ORDER — WITCH HAZEL-GLYCERIN EX PADS
1.0000 "application " | MEDICATED_PAD | CUTANEOUS | Status: DC | PRN
  Administered 2014-06-11: 1 via TOPICAL

## 2014-06-10 MED ORDER — FENTANYL CITRATE 0.05 MG/ML IJ SOLN
INTRAMUSCULAR | Status: AC
  Filled 2014-06-10: qty 2

## 2014-06-10 MED ORDER — TETANUS-DIPHTH-ACELL PERTUSSIS 5-2.5-18.5 LF-MCG/0.5 IM SUSP
0.5000 mL | Freq: Once | INTRAMUSCULAR | Status: AC
Start: 2014-06-11 — End: 2014-06-11
  Administered 2014-06-11: 0.5 mL via INTRAMUSCULAR
  Filled 2014-06-10: qty 0.5

## 2014-06-10 MED ORDER — LIDOCAINE HCL (PF) 1 % IJ SOLN
INTRAMUSCULAR | Status: DC | PRN
  Administered 2014-06-10: 4 mL
  Administered 2014-06-10: 6 mL

## 2014-06-10 MED ORDER — SENNOSIDES-DOCUSATE SODIUM 8.6-50 MG PO TABS
2.0000 | ORAL_TABLET | ORAL | Status: DC
  Administered 2014-06-10 – 2014-06-11 (×2): 2 via ORAL
  Filled 2014-06-10 (×2): qty 2

## 2014-06-10 MED ORDER — LANOLIN HYDROUS EX OINT: TOPICAL_OINTMENT | CUTANEOUS | Status: DC | PRN

## 2014-06-10 MED ORDER — ZOLPIDEM TARTRATE 5 MG PO TABS: 5.0000 mg | ORAL_TABLET | Freq: Every evening | ORAL | Status: DC | PRN

## 2014-06-10 MED ORDER — EPHEDRINE 5 MG/ML INJ
10.0000 mg | INTRAVENOUS | Status: DC | PRN
  Filled 2014-06-10: qty 2

## 2014-06-10 MED ORDER — OXYCODONE-ACETAMINOPHEN 5-325 MG PO TABS: 2.0000 | ORAL_TABLET | ORAL | Status: DC | PRN

## 2014-06-10 MED ORDER — NALBUPHINE HCL 10 MG/ML IJ SOLN: 5.0000 mg | INTRAMUSCULAR | Status: DC | PRN

## 2014-06-10 MED ORDER — CITRIC ACID-SODIUM CITRATE 334-500 MG/5ML PO SOLN: 30.0000 mL | ORAL | Status: DC | PRN

## 2014-06-10 MED ORDER — ONDANSETRON HCL 4 MG/2ML IJ SOLN: 4.0000 mg | INTRAMUSCULAR | Status: DC | PRN

## 2014-06-10 MED ORDER — DIPHENHYDRAMINE HCL 50 MG/ML IJ SOLN: 12.5000 mg | INTRAMUSCULAR | Status: DC | PRN

## 2014-06-10 MED ORDER — OXYCODONE-ACETAMINOPHEN 5-325 MG PO TABS
1.0000 | ORAL_TABLET | ORAL | Status: DC | PRN
  Administered 2014-06-10: 1 via ORAL
  Filled 2014-06-10 (×2): qty 1

## 2014-06-10 MED ORDER — FENTANYL 2.5 MCG/ML BUPIVACAINE 1/10 % EPIDURAL INFUSION (WH - ANES)
14.0000 mL/h | INTRAMUSCULAR | Status: DC | PRN
  Administered 2014-06-10 (×2): 14 mL/h via EPIDURAL
  Filled 2014-06-10: qty 125

## 2014-06-10 MED ORDER — ONDANSETRON HCL 4 MG/2ML IJ SOLN
4.0000 mg | Freq: Four times a day (QID) | INTRAMUSCULAR | Status: DC | PRN
  Administered 2014-06-10: 4 mg via INTRAVENOUS
  Filled 2014-06-10: qty 2

## 2014-06-10 MED ORDER — LACTATED RINGERS IV SOLN
500.0000 mL | Freq: Once | INTRAVENOUS | Status: AC
  Administered 2014-06-10: 500 mL via INTRAVENOUS

## 2014-06-10 MED ORDER — BENZOCAINE-MENTHOL 20-0.5 % EX AERO
1.0000 "application " | INHALATION_SPRAY | CUTANEOUS | Status: DC | PRN
  Administered 2014-06-10: 1 via TOPICAL
  Filled 2014-06-10: qty 56

## 2014-06-10 MED ORDER — FENTANYL CITRATE 0.05 MG/ML IJ SOLN
100.0000 ug | Freq: Once | INTRAMUSCULAR | Status: AC
Start: 2014-06-10 — End: 2014-06-10
  Administered 2014-06-10: 100 ug via INTRAVENOUS

## 2014-06-10 MED ORDER — PHENYLEPHRINE 40 MCG/ML (10ML) SYRINGE FOR IV PUSH (FOR BLOOD PRESSURE SUPPORT)
80.0000 ug | PREFILLED_SYRINGE | INTRAVENOUS | Status: DC | PRN
  Filled 2014-06-10: qty 20
  Filled 2014-06-10: qty 2

## 2014-06-10 MED ORDER — FENTANYL 2.5 MCG/ML BUPIVACAINE 1/10 % EPIDURAL INFUSION (WH - ANES): 14.0000 mL/h | INTRAMUSCULAR | Status: DC | PRN

## 2014-06-10 MED ORDER — OXYTOCIN 40 UNITS IN LACTATED RINGERS INFUSION - SIMPLE MED
1.0000 m[IU]/min | INTRAVENOUS | Status: DC
Start: 2014-06-10 — End: 2014-06-10
  Administered 2014-06-10: 666 m[IU]/min via INTRAVENOUS

## 2014-06-10 MED ORDER — SIMETHICONE 80 MG PO CHEW: 80.0000 mg | CHEWABLE_TABLET | ORAL | Status: DC | PRN

## 2014-06-10 NOTE — Progress Notes (Signed)
Labor Progress  Subjective: Pt very sleepy  Objective: BP 106/64 mmHg  Pulse 91  Temp(Src) 98.2 F (36.8 C) (Oral)  Resp 18  Ht 5\' 6"  (1.676 m)  Wt 169 lb (76.658 kg)  BMI 27.29 kg/m2  SpO2 97%  LMP 09/05/2013    FHT:135, moderate variability, + accel, no decels CTX:  regular, every 3-5 minutes Uterus gravid, soft non tender SVE:  Dilation: 10 Effacement (%): 100 Station: -1 Exam by:: Zaakirah Kistner, cnm   Assessment:  IUP at 39.5 weeks NICHD: Category Membranes:  AROM x 2hrs, no s/s of infection Labor progress: adquate labor - transition GBS: negative   Plan: Continue labor plan In & out cath Continuous monitoring Labor down Will reassess with cervical exam at 1000 or earlier if necessary       Monti Villers, CNM, MSN 06/10/2014. 8:44 AM

## 2014-06-10 NOTE — Lactation Note (Signed)
This note was copied from the chart of Melissa Medea Goodin. Lactation Consultation Note  Initial visit done in birthing suites.  RN called for [facility] assist due to inverted nipples.  Baby is 3 minutes old and cueing.  Both nipples are inverted and mom was not successful breastfeeding her first baby.  Demonstrated hand expression but no colostrum noted.  Baby attempted to latch several times but unable to sustain latch.  24 mm nipple shield applied and baby latched well after a few attempts.  Baby nursed actively for 20 minutes and then assisted to opposite breast with nipple shield in place.   Mom shown breast massage. Colostrum noted in shield when baby came off the first breast.  Instructed on feeding with any feeding cue and calling for assist prn.  Mom is very tired and will need review.  Patient Name: Melissa Freeman SRPRX'Y Date: 06/10/2014 Reason for consult: Initial assessment;Difficult latch   Maternal Data Has patient been taught Hand Expression?: Yes Does the patient have breastfeeding experience prior to this delivery?: Yes  Feeding Feeding Type: Breast Fed Length of feed: 30 min  LATCH Score/Interventions Latch: Repeated attempts needed to sustain latch, nipple held in mouth throughout feeding, stimulation needed to elicit sucking reflex. Intervention(s): Adjust position;Assist with latch;Breast massage  Audible Swallowing: A few with stimulation Intervention(s): Skin to skin;Hand expression;Alternate breast massage  Type of Nipple: Inverted Intervention(s): Reverse pressure  Comfort (Breast/Nipple): Soft / non-tender     Hold (Positioning): Assistance needed to correctly position infant at breast and maintain latch. Intervention(s): Breastfeeding basics reviewed;Support Pillows;Position options;Skin to skin  LATCH Score: 5  Lactation Tools Discussed/Used Tools: Nipple Shields Nipple shield size: 24   Consult Status Consult Status: Follow-up Date:  06/10/14    Ave Filter 06/10/2014, 11:35 AM

## 2014-06-10 NOTE — Progress Notes (Signed)
  Subjective: Received one dose of Fentanyl at 05:17 AM. Now ready for epidural.  Objective: BP 111/69 mmHg  Pulse 86  Temp(Src) 98.3 F (36.8 C)  Resp 16  Ht 5\' 6"  (1.676 m)  Wt 169 lb (76.658 kg)  BMI 27.29 kg/m2  SpO2 99%  LMP 09/05/2013     FHT: BL 140 w/ moderate variability, +accels, earlys, no lates UC:   Irregular, but making cervical change SVE:   Dilation: 7 Effacement (%): 100 Station: -1 Exam by:: Lamonte Sakai, RN   Assessment:  IUP at term Progressive labor Cat 1 FHRT  Plan: AROM and/or Pitocin when comfortable w/ epidural Consult prn Expect progress and SVD  Farrel Gordon CNM 06/10/2014, 6:17 AM

## 2014-06-10 NOTE — Anesthesia Postprocedure Evaluation (Signed)
Anesthesia Post Note  Patient: Melissa Freeman  Procedure(s) Performed: * No procedures listed *  Anesthesia type: Epidural  Patient location: Mother/Baby  Post pain: Pain level controlled  Post assessment: Post-op Vital signs reviewed  Last Vitals:  Filed Vitals:   06/10/14 1730  BP: 114/64  Pulse: 81  Temp: 36.8 C  Resp: 16    Post vital signs: Reviewed  Level of consciousness:alert  Complications: No apparent anesthesia complications

## 2014-06-10 NOTE — Anesthesia Procedure Notes (Signed)
Epidural Patient location during procedure: OB  Preanesthetic Checklist Completed: patient identified, site marked, surgical consent, pre-op evaluation, timeout performed, IV checked, risks and benefits discussed and monitors and equipment checked  Epidural Patient position: sitting Prep: site prepped and draped and DuraPrep Patient monitoring: continuous pulse ox and blood pressure Approach: midline Location: L3-L4 Injection technique: LOR air  Needle:  Needle type: Tuohy  Needle gauge: 17 G Needle length: 9 cm and 9 Needle insertion depth: 5 cm cm Catheter type: closed end flexible Catheter size: 19 Gauge Catheter at skin depth: 10 cm Test dose: negative  Assessment Events: blood not aspirated, injection not painful, no injection resistance, negative IV test and no paresthesia  Additional Notes Dosing of Epidural:  1st dose, through catheter ............................................Marland Kitchen  Xylocaine 40 mg  2nd dose, through catheter, after waiting 3 minutes........Marland KitchenXylocaine 60 mg    ( 1% Xylo charted as a single dose in Epic Meds for ease of charting; actual dosing was fractionated as above, for saftey's sake)  As each dose occurred, patient was free of IV sx; and patient exhibited no evidence of SA injection.  Patient is more comfortable after epidural dosed. Please see RN's note for documentation of vital signs,and FHR which are stable.  Patient reminded not to try to ambulate with numb legs, and that an RN must be present when she attempts to get up.

## 2014-06-10 NOTE — H&P (Signed)
Pt arrived to MAU at ~ 01:18 AM and stated ctxs had spaced out while en route to hospital. Cvx on arrival was 4/70/-2. Cat 1 FHRT obtained. Pt ambulated and returned for re-evaluation.  Melissa Freeman is a 29 y.o. female, G4P1021 at 39.5 weeks, admitted in active labor. Ctxs began around midnight, q 5-10 min lasting ~ 60 secs, worse w/ ambulation. Reports active fetus. Denies LOF or VB.   Patient Active Problem List   Diagnosis Date Noted  . Normal labor 06/10/2014  . Late prenatal care 06/10/2014  . VDRL positive with neg FTA on 01/20/14. RPR neg on 03/18/2014 05/27/2014    History of present pregnancy: Patient entered care at 20.3 weeks.   EDC of 06/12/2014 was established by sure LMP and that was c/w a 13.6 wk scan.   Anatomy scan:  20.3 weeks, with normal findings and a posterior placenta. No previa.   Additional [place] evaluations:  13.6 wks (completed in MAU due to pain) - SIUP, ? Placenta previa.   Significant prenatal events: Late entry to care. Unable to retrieve pt's past OB records. VDRL reactive at NOB; however FTA neg w/ RPR titer 1:1. RPR NR on 03/18/2014; neg ANA screen. Pt denied past treatment for syphilis. Struggled with various common discomforts of pregnancy; all alleviated conservatively. Treated for UTI x 2 with neg TOC x 1 (2nd UTI). 2nd UTI was for presumptive sx of burning on urination; was also treated for yeast vaginitis, both at 36 wks. Initial UTI was at RN interview. Seen in MAU on 05/27/2014 (37.5 wks) for ctxs. Declined Tdap, received flu vaccine on 01/26/2014. Last baby almost 8 years ago.   Last evaluation: Office on 06/09/2014 by Dr. Alesia Richards. Cervix 5/95/-6, cephalic presentation, membranes swept. BP 90/50.  OB History    Gravida Para Term Preterm AB TAB SAB Ectopic Multiple Living   4 1 1  2  2   1     SVD on 02/19/2007 @ 40 wks, 9hr labor, female infant, birthwt 6-14, Epidural, [facility] - delivered by faculty - Pt noted to be anemic. SAB on 04/07/2009 @ 12  wks, D&C, no complications Ectopic on 07/06/2009 - expectant management, no complications   Past Medical History  Diagnosis Date  . Tubal pregnancy 2011   Past Surgical History  Procedure Laterality Date  . Appendectomy  age 1  . Intrauterine device (iud) insertion      mirena inserted 02-06-11? pt thinks 2011 instead  Wisdom teeth extraction at age 57 D&C due to SAB in 2010  Family History: family history includes Hyperlipidemia in her brother, father, and mother; Stroke in her maternal grandfather. Asthma in her son, Lupus in her sister and Anemia in her MGF. Social History:  reports that she has never smoked. She has never used smokeless tobacco. She reports that she drinks alcohol. She reports that she does not use illicit drugs.Pt is of Asian descent, Catholic, with two years of college. She is married to [facility] who is present and supportive, as well as her mother.   Prenatal Transfer Tool  Maternal Diabetes: No Genetic Screening: Normal Maternal Ultrasounds/Referrals: Normal Fetal Ultrasounds or other Referrals:  None Maternal Substance Abuse:  No Significant Maternal Medications:  Meds include: Other: PNV Significant Maternal Lab Results: Lab values include: Group B Strep negative    TDAP: Declined Flu: 01/26/2014  Pt will accept blood products in the event of an emergency  ROS: Ctxs, +FM, -VB, -LOF  No Known Allergies   Dilation: 5 Effacement (%):  [place]2 Exam by:: K. Lorielle Boehning,CNM Blood pressure 117/72, pulse 84, resp. rate 18, height 5\' 6"  (1.676 m), weight 169 lb (76.658 kg), last menstrual period 09/05/2013, SpO2 99 %.  Chest clear Heart RRR without murmur Abd gravid, NT, FH 37 cm Pelvic: See above EFW: 7-7 1/4 lbs; cephalic by Leopolds Ext: WNL Bishop score: 9  FHR: Category 1 UCs: Irregular  Prenatal labs: ABO, Rh:  B+ (01/20/14) Antibody:  Neg (01/20/14) Rubella:   Immune (01/20/14) RPR:  Reactive with neg confirmatory test and RPR  titer 1:1 on 01/20/14. VRDL non-reactive on 03/18/14 HBsAg:  Neg (01/20/14) HIV:  Neg (01/20/14) GBS: Negative (01/13 0000) Sickle cell/Hgb electrophoresis: NA Pap: Normal on 02/25/14 GC:  Neg on 12/09/13 Chlamydia: Neg on 12/09/13 Genetic screenings: Neg Glucola: Normal at 95 Other: Neg ANA screen on 01/26/2014, neg urine culture on 04/27/2014 Hgb 12.6 at NOB, 11.3 at 28 weeks and 11.4 on 01/20/14  Assessment: IUP at 39.5 wks Active labor GBS neg Cat 1 FHRT  Plan: Admit to [place] orders Pain med/epidural prn Reviewed R&B of Pitocin augmentation; pt in agreement with proceeding  Consult prn Anticipate progress and SVD  Laurey Morale, [place] 06/10/2014, 4:48 AM

## 2014-06-10 NOTE — Progress Notes (Signed)
S: Comfortable w/ epidural. Family at bedside. O:  Today's Vitals   06/10/14 0638 06/10/14 0641 06/10/14 0642 06/10/14 0643  BP:  118/63 118/63   Pulse: 87 83 83 84  Temp:      Resp:  16    Height:      Weight:      SpO2: 99%  97% 97%  PainSc:       Cat 1 FHRT Ctxs q 5 min Cvx 8/C/-1 AROM, small amount of clear fluid  A: IUP at term Active labor  P: Consult prn Expect progress and SVD  Farrel Gordon, CNM 06/10/2014, 06:57 AM

## 2014-06-10 NOTE — Anesthesia Preprocedure Evaluation (Signed)

## 2014-06-11 LAB — CBC
HCT: 36.4 % (ref 36.0–46.0)
HEMOGLOBIN: 11.8 g/dL — AB (ref 12.0–15.0)
MCH: 25.9 pg — ABNORMAL LOW (ref 26.0–34.0)
MCHC: 32.4 g/dL (ref 30.0–36.0)
MCV: 80 fL (ref 78.0–100.0)
Platelets: 231 10*3/uL (ref 150–400)
RBC: 4.55 MIL/uL (ref 3.87–5.11)
RDW: 14.6 % (ref 11.5–15.5)
WBC: 8.2 10*3/uL (ref 4.0–10.5)

## 2014-06-11 LAB — RPR: RPR Ser Ql: REACTIVE — AB

## 2014-06-11 LAB — RPR, QUANT+TP ABS (REFLEX)
Rapid Plasma Reagin, Quant: 1:1 {titer} — ABNORMAL HIGH
TREPONEMA PALLIDUM AB: NEGATIVE

## 2014-06-11 NOTE — Progress Notes (Signed)
UR chart review completed.  

## 2014-06-11 NOTE — Progress Notes (Signed)
Melissa Freeman  Post Partum Day 1:S/P Vaginal and Labial Laceration  Subjective: Patient up ad lib, denies syncope or dizziness. Reports consuming regular diet without issues and denies N/V. Denies issues with urination and reports bleeding is "light."  Patient is breastfeeding and bottlefeeding and reports infant is doing well.  Desires NFP for postpartum contraception.  Pain is being appropriately managed with use of ibuprofen and percocet.  Objective: Filed Vitals:   06/10/14 1330 06/10/14 1730 06/11/14 0145 06/11/14 0523  BP: 107/62 114/64 93/42 113/67  Pulse: 98 81 73 80  Temp: 98.6 F (37 C) 98.3 F (36.8 C) 98.2 F (36.8 C) 97.8 F (36.6 C)  TempSrc: Oral Oral Oral Oral  Resp: 18 16 18 18   Height:      Weight:      SpO2: 98% 98%      Recent Labs  06/10/14 0415 06/11/14 0550  HGB 12.5 11.8*  HCT 38.4 36.4    Physical Exam:  General: alert, cooperative and no distress Mood/Affect: Appropriate Lungs: clear to auscultation, no wheezes, rales or rhonchi, symmetric air entry.  Heart: normal rate and regular rhythm. Abdomen:  + bowel sounds, soft, NT Uterine Fundus: Firm at U/-2 Lochia: appropriate Laceration: Not assessed due to location Skin: Warm, dry DVT Evaluation: No evidence of DVT seen on physical exam. No significant calf/ankle edema.  Assessment S/P Vaginal Delivery-Day 1 Normal [employer] Breast and BottleFeeding  Plan:  Patient request discharge today, but infant status unknown Nurse instructed to contact peds and determine infant status Will discharge if appropriate Lactation Consult Continue current care Dr.N. Dillard to be updated on patient status   Melissa Westry LYNN, MSN, CNM 06/11/2014, 10:59 AM

## 2014-06-11 NOTE — Lactation Note (Addendum)
This note was copied from the chart of Melissa Freeman. Lactation Consultation Note Requested LC to assist in latching. Mom had questions. Saw some blood from Lt. Nipple in colostrum. Mom has inverted nipples, using #24 NS. Pre-pump w/hand pump to assist in everting nipples more. Shells given to wear in bra to assist in everting nipples. Hand expression taught, noted colostrum to end of nipple. Encouraged "C" hold while latching and keeping baby cheek to breast. Using props for arms and hands d/t baby is heavy. Mom reminded frequently to keep baby closer to breast. Nipples pulls out well into NS. Basic BF teaching and positioning reviewed. Grandmother had baby wrapped in to many clothes and blankets, covering while BF. Got mom to feel how hot the baby was and encouraged baby was ok not to cover so much and reasoning. Patient Name: Melissa Freeman AYOKH'T Date: 06/11/2014 Reason for consult: Follow-up assessment   Maternal Data    Feeding Feeding Type: Breast Fed Nipple Type: Slow - flow Length of feed: 10 min (still BF)  LATCH Score/Interventions Latch: Grasps breast easily, tongue down, lips flanged, rhythmical sucking. Intervention(s): Adjust position;Assist with latch;Breast massage;Breast compression  Audible Swallowing: A few with stimulation Intervention(s): Skin to skin;Hand expression;Alternate breast massage  Type of Nipple: Inverted Intervention(s): Shells;Hand pump  Comfort (Breast/Nipple): Soft / non-tender     Hold (Positioning): Assistance needed to correctly position infant at breast and maintain latch. Intervention(s): Breastfeeding basics reviewed;Support Pillows;Position options;Skin to skin  LATCH Score: 6  Lactation Tools Discussed/Used Tools: Shells;Nipple [place] Fuel;Pump Nipple shield size: 24 Shell Type: Inverted Breast pump type: Manual Pump Review: Setup, frequency, and cleaning;Milk Storage Initiated by:: RN Date initiated::  06/10/14   Consult Status Consult Status: Follow-up Date: 06/12/14 Follow-up type: In-patient    Shonette Rhames, [place]ta [place] 06/11/2014, 9:08 AM

## 2014-06-12 ENCOUNTER — Encounter (HOSPITAL_COMMUNITY): Payer: Self-pay | Admitting: *Deleted

## 2014-06-12 MED ORDER — IBUPROFEN 600 MG PO TABS: 600.0000 mg | ORAL_TABLET | Freq: Four times a day (QID) | ORAL | Status: AC | PRN

## 2014-06-12 NOTE — Lactation Note (Signed)
This note was copied from the chart of Melissa Freeman. Lactation Consultation Note: follow up visit with mom. Baby is not going home today. Mom reports that she is having trouble getting the baby to latch- has inverted nipples. Has tried with NS.Liana Gerold assist with latch. Baby just had formula. Attempted to latch but baby would not latch- going off to sleep.   Using manual pump and reports it hurts. DEBP set up with instructions for use and cleaning pump parts. Mom pumping as I left room. Suggested using #27 flanges and mom reports that feels better. Few drops of Colostrum obtained. To feed to baby at next feeding. To call for assist when baby wakes for next feeding. No questions at present.   Patient Name: Melissa Freeman GOVPC'H Date: 06/12/2014 Reason for consult: Follow-up assessment   Maternal Data Formula Feeding for Exclusion: Yes Reason for exclusion: Mother's choice to formula and breast feed on admission Does the patient have breastfeeding experience prior to this delivery?: Yes  Feeding    LATCH Score/Interventions                      Lactation Tools Discussed/Used WIC Program: No Pump Review: Setup, frequency, and cleaning Initiated by:: DW Date initiated:: 06/12/14   Consult Status Consult Status: Follow-up Date: 06/12/14 Follow-up type: In-patient    Truddie Crumble 06/12/2014, 10:52 AM

## 2014-06-12 NOTE — Discharge Instructions (Signed)

## 2014-06-12 NOTE — Progress Notes (Addendum)
In to see patient for anticipated d/c today. She advises baby is being evaluated due to maternal + RPR on admission (1:1). Patient with hx of reactive RPR on initial PN labs (2.01), with NEGATIVE CONFIRMATORY TESTING). Had negative RPR at 28 weeks. Had reactive RPR on admission (1:1), with negative treponeal pallidum.  RPR pending on baby.  May not be resulted today. Will process d/c on mother, and she will await peds decision regarding baby.  Donnel Saxon, CNM 06/12/14 9a

## 2014-06-12 NOTE — Discharge Summary (Signed)
Vaginal Delivery Discharge Summary  Melissa Freeman  DOB:    1985-08-03 MRN:    [id number] CSN:    [id number]  Date of admission:                  06/10/14  Date of discharge:                   06/12/14  Procedures this admission:   SVB, repair of 2nd degree vaginal, left labial laceration  Date of Delivery: 06/10/14  Newborn Data:  Live born female  Birth Weight: 9 lb 2.6 oz (4156 g) APGAR: 9, 9   Name: Kenan Circumcision Plan: Declines  History of Present Illness:  Melissa Freeman is a 29 y.o. female, [id number], who presents at [redacted]w[redacted]d weeks gestation. The patient has been followed at the [facility] division of [facility]. She was admitted onset of labor. Her pregnancy has been complicated by:  Patient Active Problem List   Diagnosis Date Noted  . Normal labor 06/10/2014  . Late prenatal care 06/10/2014  . Vaginal delivery 06/10/2014  . VDRL positive with neg FTA on 01/20/14. RPR neg on 03/18/2014, + on admission 06/10/14, but neg confirmatory testing 05/27/2014     Hospital Course:  Admitted 06/10/14 in early labor. Negative GBS. Progressed spontaneously, with AROM. Utilized epidural for pain management.  Delivery was performed by [facility], CNM, without complication. Patient and baby tolerated the procedure without difficulty, with 2nd degree vaginal and left labial lacerations noted. Infant status was stable and remained in room with mother.  Mother and infant then had an uncomplicated postpartum course, with breast feeding going relatively well--LC consult was obtained due to inverted nipples.  Mom's physical exam was WNL, and she was discharged home in stable condition. Contraception plan was NFP.  She received adequate benefit from po pain medications.  On day of d/c, patient advised baby was being evaluated due to maternal + RPR on admission (1:1). Patient with hx of reactive RPR on initial PN labs (2.01),  with NEGATIVE CONFIRMATORY TESTING). Had negative RPR at 28 weeks. Had reactive RPR on admission (1:1), with negative treponeal pallidum.  RPR pending on baby. May not be resulted today. Will process d/c on mother, and she will await peds decision regarding baby.   Feeding:  breast  Contraception:  natural family planning (NFP)  Discharge hemoglobin:  HEMOGLOBIN  Date Value Ref Range Status  06/11/2014 11.8* 12.0 - 15.0 g/dL Final   HCT  Date Value Ref Range Status  06/11/2014 36.4 36.0 - 46.0 % Final    Discharge Physical Exam:   General: alert Lochia: appropriate Uterine Fundus: firm Incision: healing well DVT Evaluation: No evidence of DVT seen on physical exam. Negative Homan's sign.  Intrapartum Procedures: spontaneous vaginal delivery Postpartum Procedures: none Complications-Operative and Postpartum: 2nd degree vaginal and left labial lacerations.  Discharge Diagnoses: Term Pregnancy-delivered, False positive RPR  Discharge Information:  Activity:           pelvic rest Diet:                routine Medications: Ibuprofen Condition:      stable Instructions:     Discharge to: home  Follow-up Information    Follow up with [facility]. Schedule an appointment as soon as possible for a visit in 6 weeks.   Specialty:  Obstetrics and Gynecology   Why:  Call for any questions or concerns.  Contact information:   [address] [place] [address] [phone]       Kitana Gage, [place] 06/12/2014 9:04 AM

## 2014-06-13 ENCOUNTER — Ambulatory Visit: Payer: Self-pay

## 2014-06-13 NOTE — Lactation Note (Signed)
This note was copied from the chart of Melissa Esti Calixte. Lactation Consultation Note Engorged. Mom has been bottle feeding formula, stated her milk wasn't in and when she pumped she didn't get hardly anything. Explained that colostrum is thick and hand expression can get more out than pumping. ICE applied. Hand massage and expression to relieve breast. Mom done one breast and I done the other. Mom in pain, took Ibuprofen. Expressed 50 ml colostrum. Engorgement information sheet given to prevent from getting backed up. Noted milk pocket under Lt. Axillary area. Noted supernumerary dimples at both axillary areas. No drainage. Encouraged to massage downwards and ICE if needed. Encouraged to put baby to breast using NS to BF and empty breast. Has single electric pump. Patient Name: Melissa Freeman [id number] Date: 06/13/2014 Reason for consult: Follow-up assessment;Breast/nipple pain   Maternal Data    Feeding Feeding Type: Breast Milk Nipple Type: Slow - flow  LATCH Score/Interventions          Comfort (Breast/Nipple): Engorged, cracked, bleeding, large blisters, severe discomfort Problem noted: Engorgment Intervention(s): Ice;Hand expression           Lactation Tools Discussed/Used Tools: Nipple [place] Fuel;Pump Nipple shield size: 24 Shell Type: Inverted Breast pump type: Double-Electric Breast Pump   Consult Status Consult Status: Complete Date: 06/13/14 Follow-up type: Call as needed    Lazara Grieser, [place] 06/13/2014, 11:12 AM

## 2014-07-01 ENCOUNTER — Inpatient Hospital Stay (HOSPITAL_COMMUNITY)
Admission: AD | Admit: 2014-07-01 | Discharge: 2014-07-01 | Disposition: A | Discharge status: Home / Self Care | Payer: Medicaid Other | Source: Ambulatory Visit | Attending: Obstetrics & Gynecology | Admitting: Obstetrics & Gynecology

## 2014-07-01 ENCOUNTER — Inpatient Hospital Stay (HOSPITAL_COMMUNITY): Payer: Medicaid Other

## 2014-07-01 ENCOUNTER — Encounter (HOSPITAL_COMMUNITY): Payer: Self-pay

## 2014-07-01 DIAGNOSIS — R1011 Right upper quadrant pain: Secondary | ICD-10-CM

## 2014-07-01 DIAGNOSIS — O9089 Other complications of the puerperium, not elsewhere classified: Secondary | ICD-10-CM | POA: Insufficient documentation

## 2014-07-01 LAB — COMPREHENSIVE METABOLIC PANEL
ALK PHOS: 116 U/L (ref 39–117)
ALT: 23 U/L (ref 0–35)
ANION GAP: 7 (ref 5–15)
AST: 20 U/L (ref 0–37)
Albumin: 3.5 g/dL (ref 3.5–5.2)
BUN: 15 mg/dL (ref 6–23)
CALCIUM: 8.8 mg/dL (ref 8.4–10.5)
CO2: 24 mmol/L (ref 19–32)
Chloride: 103 mmol/L (ref 96–112)
Creatinine, Ser: 0.64 mg/dL (ref 0.50–1.10)
GFR calc Af Amer: >90 mL/min (ref >90)
GFR calc non Af Amer: >90 mL/min (ref >90)
GLUCOSE: 104 mg/dL — AB (ref 70–99)
Potassium: 3.8 mmol/L (ref 3.5–5.1)
SODIUM: 134 mmol/L — AB (ref 135–145)
TOTAL PROTEIN: 7.7 g/dL (ref 6.0–8.3)
Total Bilirubin: 0.5 mg/dL (ref 0.3–1.2)

## 2014-07-01 LAB — URINALYSIS, ROUTINE W REFLEX MICROSCOPIC
Bilirubin Urine: NEGATIVE
GLUCOSE, UA: NEGATIVE mg/dL
Ketones, ur: NEGATIVE mg/dL
Nitrite: NEGATIVE
PH: 5.5 (ref 5.0–8.0)
Protein, ur: NEGATIVE mg/dL
SPECIFIC GRAVITY, URINE: 1.025 (ref 1.005–1.030)
Urobilinogen, UA: 1 mg/dL (ref 0.0–1.0)

## 2014-07-01 LAB — D-DIMER, QUANTITATIVE (NOT AT ARMC): D DIMER QUANT: 2.38 ug{FEU}/mL — AB (ref 0.00–0.48)

## 2014-07-01 LAB — CBC WITH DIFFERENTIAL/PLATELET
Basophils Absolute: 0 10*3/uL (ref 0.0–0.1)
Basophils Relative: 0 % (ref 0–1)
Eosinophils Absolute: 0.3 10*3/uL (ref 0.0–0.7)
Eosinophils Relative: 3 % (ref 0–5)
HCT: 39.5 % (ref 36.0–46.0)
HEMOGLOBIN: 13 g/dL (ref 12.0–15.0)
LYMPHS ABS: 1.5 10*3/uL (ref 0.7–4.0)
LYMPHS PCT: 16 % (ref 12–46)
MCH: 26.5 pg (ref 26.0–34.0)
MCHC: 32.9 g/dL (ref 30.0–36.0)
MCV: 80.6 fL (ref 78.0–100.0)
MONOS PCT: 9 % (ref 3–12)
Monocytes Absolute: 0.8 10*3/uL (ref 0.1–1.0)
NEUTROS PCT: 72 % (ref 43–77)
Neutro Abs: 6.5 10*3/uL (ref 1.7–7.7)
PLATELETS: 228 10*3/uL (ref 150–400)
RBC: 4.9 MIL/uL (ref 3.87–5.11)
RDW: 14.1 % (ref 11.5–15.5)
WBC: 9.2 10*3/uL (ref 4.0–10.5)

## 2014-07-01 LAB — URINE MICROSCOPIC-ADD ON

## 2014-07-01 LAB — LIPASE, BLOOD: Lipase: 28 U/L (ref 11–59)

## 2014-07-01 LAB — AMYLASE: Amylase: 54 U/L (ref 0–105)

## 2014-07-01 MED ORDER — IOHEXOL 300 MG/ML  SOLN
50.0000 mL | INTRAMUSCULAR | Status: AC
  Administered 2014-07-01: 50 mL via ORAL

## 2014-07-01 MED ORDER — ONDANSETRON HCL 4 MG/2ML IJ SOLN
4.0000 mg | Freq: Once | INTRAMUSCULAR | Status: AC
  Administered 2014-07-01: 4 mg via INTRAVENOUS
  Filled 2014-07-01: qty 2

## 2014-07-01 MED ORDER — IOHEXOL 350 MG/ML SOLN
100.0000 mL | Freq: Once | INTRAVENOUS | Status: AC | PRN
  Administered 2014-07-01: 100 mL via INTRAVENOUS

## 2014-07-01 MED ORDER — IOHEXOL 300 MG/ML  SOLN
100.0000 mL | Freq: Once | INTRAMUSCULAR | Status: AC | PRN
  Administered 2014-07-01: 100 mL via INTRAVENOUS

## 2014-07-01 MED ORDER — KETOROLAC TROMETHAMINE 30 MG/ML IJ SOLN
30.0000 mg | Freq: Once | INTRAMUSCULAR | Status: AC
  Administered 2014-07-01: 30 mg via INTRAVENOUS
  Filled 2014-07-01: qty 1

## 2014-07-01 MED ORDER — OXYCODONE-ACETAMINOPHEN 5-325 MG PO TABS
1.0000 | ORAL_TABLET | Freq: Once | ORAL | Status: AC
  Administered 2014-07-01: 1 via ORAL
  Filled 2014-07-01: qty 1

## 2014-07-01 NOTE — MAU Provider Note (Signed)
History  Melissa Freeman is a 29 yo [id number] s/p SVD on 10 Jun 2014 presents to MAU after calling w/ c/o acute onset of constant sharp pain under her right breast after getting out of the shower 2 days ago. States pain came out of no where and finds it to be worse at night, and when she takes in a deep breath. Pain wraps around to her upper back, radiating to the upper right quadrant of her abdomen, down to the very bottom. States suffered cold and chills when this initially began, but none now. Reports pain 3-4/10 during the day and 9-10/10 at night. States has to walk very slowly when pain is at its worst and finds it hard to sleep or care for her newborn. Denies N/V/D. Reports occasional constipation, but normal BM yesterday. Last ate between 7-8 pm last evening (chicken and broccoli) w/o incident. Pain remains the same with or without food. Last took Ibuprofen between 5-6 PM yesterday and stopped taking due to not wanting to "mask" her sxs. Denies urinary sxs, cough, gas pain, hx of gallstones or hernia. Reports appendectomy at age 73. Further denies fever, syncope or presyncope.  PP course: Normal vaginal delivery. Stopped breastfeeding 3 days ago.  Patient Active Problem List   Diagnosis Date Noted  . Abdominal pain, right upper quadrant 07/01/2014  . Vaginal delivery 06/10/2014  . VDRL positive with neg FTA on 01/20/14. RPR neg on 03/18/2014, + on admission 06/10/14, but neg confirmatory testing 05/27/2014    No chief complaint on file.  HPI As above OB History    Gravida Para Term Preterm AB TAB SAB Ectopic Multiple Living   4 2 2  2  2   0 2      Past Medical History  Diagnosis Date  . Tubal pregnancy 2011  . History of anemia   . Hx of appendectomy   . H/O dilation and curettage 2010    Past Surgical History  Procedure Laterality Date  . Appendectomy  age 80  . Intrauterine device (iud) insertion      mirena inserted 02-06-11? pt thinks 2011 instead  . Wisdom tooth extraction       Family History  Problem Relation Age of Onset  . Hyperlipidemia Mother   . Hyperlipidemia Father   . Stroke Maternal Grandfather   . Hyperlipidemia Brother     History  Substance Use Topics  . Smoking status: Never Smoker   . Smokeless tobacco: Never Used  . Alcohol Use: Yes     Comment: occ    Allergies: No Known Allergies  Prescriptions prior to admission  Medication Sig Dispense Refill Last Dose  . docusate sodium (COLACE) 100 MG capsule Take 100 mg by mouth daily as needed for mild constipation.   Past Week at Unknown time  . ibuprofen (ADVIL,MOTRIN) 600 MG tablet Take 1 tablet (600 mg total) by mouth every 6 (six) hours as needed. 30 tablet 2 06/30/2014 at 1800  . Prenatal Vit-Fe Fumarate-FA (MULTIVITAMIN-PRENATAL) 27-0.8 MG TABS tablet Take 1 tablet by mouth daily at 12 noon.   06/30/2014 at Unknown time    ROS  RUQ pain Physical Exam   Results for orders placed or performed during the hospital encounter of 07/01/14 (from the past 24 hour(s))  Urinalysis, Routine w reflex microscopic     Status: Abnormal   Collection Time: 07/01/14  1:30 AM  Result Value Ref Range   Color, Urine YELLOW YELLOW   APPearance CLEAR CLEAR   Specific Gravity,  Urine 1.025 1.005 - 1.030   pH 5.5 5.0 - 8.0   Glucose, UA NEGATIVE NEGATIVE mg/dL   Hgb urine dipstick MODERATE (A) NEGATIVE   Bilirubin Urine NEGATIVE NEGATIVE   Ketones, ur NEGATIVE NEGATIVE mg/dL   Protein, ur NEGATIVE NEGATIVE mg/dL   Urobilinogen, UA 1.0 0.0 - 1.0 mg/dL   Nitrite NEGATIVE NEGATIVE   Leukocytes, UA TRACE (A) NEGATIVE  Urine microscopic-add on     Status: Abnormal   Collection Time: 07/01/14  1:30 AM  Result Value Ref Range   Squamous Epithelial / LPF RARE RARE   WBC, UA 0-2 <3 WBC/hpf   RBC / HPF 7-10 <3 RBC/hpf   Bacteria, UA FEW (A) RARE  CBC with Differential     Status: None   Collection Time: 07/01/14  1:58 AM  Result Value Ref Range   WBC 9.2 4.0 - 10.5 K/uL   RBC 4.90 3.87 - 5.11 MIL/uL    Hemoglobin 13.0 12.0 - 15.0 g/dL   HCT 39.5 36.0 - 46.0 %   MCV 80.6 78.0 - 100.0 fL   MCH 26.5 26.0 - 34.0 pg   MCHC 32.9 30.0 - 36.0 g/dL   RDW 14.1 11.5 - 15.5 %   Platelets 228 150 - 400 K/uL   Neutrophils Relative % 72 43 - 77 %   Neutro Abs 6.5 1.7 - 7.7 K/uL   Lymphocytes Relative 16 12 - 46 %   Lymphs Abs 1.5 0.7 - 4.0 K/uL   Monocytes Relative 9 3 - 12 %   Monocytes Absolute 0.8 0.1 - 1.0 K/uL   Eosinophils Relative 3 0 - 5 %   Eosinophils Absolute 0.3 0.0 - 0.7 K/uL   Basophils Relative 0 0 - 1 %   Basophils Absolute 0.0 0.0 - 0.1 K/uL  Amylase     Status: None   Collection Time: 07/01/14  1:58 AM  Result Value Ref Range   Amylase 54 0 - 105 U/L  Lipase, blood     Status: None   Collection Time: 07/01/14  1:58 AM  Result Value Ref Range   Lipase 28 11 - 59 U/L  Comprehensive metabolic panel     Status: Abnormal   Collection Time: 07/01/14  1:58 AM  Result Value Ref Range   Sodium 134 (L) 135 - 145 mmol/L   Potassium 3.8 3.5 - 5.1 mmol/L   Chloride 103 96 - 112 mmol/L   CO2 24 19 - 32 mmol/L   Glucose, Bld 104 (H) 70 - 99 mg/dL   BUN 15 6 - 23 mg/dL   Creatinine, Ser 0.64 0.50 - 1.10 mg/dL   Calcium 8.8 8.4 - 10.5 mg/dL   Total Protein 7.7 6.0 - 8.3 g/dL   Albumin 3.5 3.5 - 5.2 g/dL   AST 20 0 - 37 U/L   ALT 23 0 - 35 U/L   Alkaline Phosphatase 116 39 - 117 U/L   Total Bilirubin 0.5 0.3 - 1.2 mg/dL   GFR calc non Af Amer >90 >90 mL/min   GFR calc Af Amer >90 >90 mL/min   Anion gap 7 5 - 15  D-dimer, quantitative     Status: Abnormal   Collection Time: 07/01/14  8:54 AM  Result Value Ref Range   D-Dimer, Quant 2.38 (H) 0.00 - 0.48 ug/mL-FEU   CLINICAL DATA: Acute right upper quadrant abdominal pain. Pain radiates to the back.  EXAM: US ABDOMEN LIMITED - RIGHT UPPER QUADRANT  COMPARISON: None.  FINDINGS: Gallbladder:  No  gallstones or wall thickening visualized. No sonographic Murphy sign noted.  Common bile duct:  Diameter: 3.3  mm.  Liver:  No focal lesion identified. Within normal limits in parenchymal echogenicity.  IMPRESSION: Normal right upper quadrant ultrasound.   Electronically Signed  By: Jeb Levering M.D.  On: 07/01/2014 04:01  Blood pressure 106/60, pulse 71, temperature 98.4 F (36.9 C), temperature source Oral, resp. rate 16, height 5\' 6"  (1.676 m), weight 153 lb (69.4 kg), SpO2 100 %, unknown if currently breastfeeding.    Physical Exam Gen: NAD Breasts: soft, non tender, nipples inverted Lungs: CTAB (hand placed under right breast when asked to take in a deep breath) +CVAT on right, neg on left CV: RRR w/o murmur Abdomen: soft, appropriate involution, rebound and guarding to deep palpation on right, no guarding or rebound on left Active BS x 4 Liver tender to palpation Pelvic: Deferred. Small lochia, no odor Ext: WNL  ED Course  CBC w/ diff Amylase Lipase CMP UA Urine culture RUQ u/s  Assessment: RUQ pain of unknown etiology Amylase, Lipase and urine culture pending S/P SVD 3 Feb 16   Plan: Discussed w/ Dr. Simona Huh Percocet tab x 1 now CT w/ contrast of pelvis and abdomen (pt expressed that she would like to wait and see if there is relief from pain medication before consenting to CT) Re-evaluate in 30 min   Farrel Gordon CNM, MS 07/01/2014 5:18 AM   ADDENDUM: Pt states pain medication only makes her drowsy and did not relieve her pain. Rating pain now as 6/10. CT w/ contrast of pelvis and abdomen ordered.  Farrel Gordon, CNM 07/01/14, 05:44 AM  ADDENDUM: Report given to oncoming CNM for continuation of care.  Farrel Gordon, CNM 07/01/2014, 07:47 AM  Addendum for Continuation of Care by Milinda Cave, CNM  [phone]:  Abdomen and Pelvic CT Negative-Mild Atelectasis Noted Consult with Dr. Carmon Sails who advises performing D-Dimer and Chest X-Ray as unable to give additional contrast for Chest CT  1045:  D. Dimer return 2.38 (0-0.48) Chest  X-Ray Negative Dr. Carmon Sails consulted and advised to call radiologist for repeat CT with contrast of Chest  1134: Dr. Achille Rich contacted and states okay to proceed with Chest CT with contrast Patient c/o pain, given Toradol 30mg  IM Nurse call report that patient does not desire repeat CT and requests discharge In room to assess, patient questioning whether additional testing will be needed.  Provider able to address questions and concerns to patient satisfaction Patient desires Chest CT Dr. Carmon Sails updated on patient status  1321: Chest CT return normal Patient informed of results Dr. Carmon Sails updated on patient status  1340: Dr. Carmon Sails in room to discuss results Instructed to monitor pain, take medications as necessary Encouraged to call if pain worsens, new onset of symptoms, or other questions and/or concerns arise Keep office appt as scheduled for PP follow up as appropriate Patient discharged to home in stable condition  Thorin Starner LYNN, MSN, CNM 07/01/2014  0302PM

## 2014-07-01 NOTE — MAU Note (Signed)
Pt reports sharp pain just under her right breast and it radiates around to her right mid back. Denies nausea, vomiting, diarrhea, or fever.

## 2014-07-01 NOTE — MAU Note (Signed)
Patient expressing that she does not want a chest CT and states "it is not necessary."  Gavin Pound, CNM notified and is coming to discuss plan of care with patient.

## 2014-07-01 NOTE — Discharge Instructions (Signed)
Abdominal Pain, Women °Abdominal (stomach, pelvic, or belly) pain can be caused by many things. It is important to tell your doctor: °· The location of the pain. °· Does it come and go or is it present all the time? °· Are there things that start the pain (eating certain foods, exercise)? °· Are there other symptoms associated with the pain (fever, nausea, vomiting, diarrhea)? °All of this is helpful to know when trying to find the cause of the pain. °CAUSES  °· Stomach: virus or bacteria infection, or ulcer. °· Intestine: appendicitis (inflamed appendix), regional ileitis (Crohn's disease), ulcerative colitis (inflamed colon), irritable bowel syndrome, diverticulitis (inflamed diverticulum of the colon), or cancer of the stomach or intestine. °· Gallbladder disease or stones in the gallbladder. °· Kidney disease, kidney stones, or infection. °· Pancreas infection or cancer. °· Fibromyalgia (pain disorder). °· Diseases of the female organs: °¨ Uterus: fibroid (non-cancerous) tumors or infection. °¨ Fallopian tubes: infection or tubal pregnancy. °¨ Ovary: cysts or tumors. °¨ Pelvic adhesions (scar tissue). °¨ Endometriosis (uterus lining tissue growing in the pelvis and on the pelvic organs). °¨ Pelvic congestion syndrome (female organs filling up with blood just before the menstrual period). °¨ Pain with the menstrual period. °¨ Pain with ovulation (producing an egg). °¨ Pain with an IUD (intrauterine device, birth control) in the uterus. °¨ Cancer of the female organs. °· Functional pain (pain not caused by a disease, may improve without treatment). °· Psychological pain. °· Depression. °DIAGNOSIS  °Your doctor will decide the seriousness of your pain by doing an examination. °· Blood tests. °· X-rays. °· Ultrasound. °· CT scan (computed tomography, special type of X-ray). °· MRI (magnetic resonance imaging). °· Cultures, for infection. °· Barium enema (dye inserted in the large intestine, to better view it with  X-rays). °· Colonoscopy (looking in intestine with a lighted tube). °· Laparoscopy (minor surgery, looking in abdomen with a lighted tube). °· Major abdominal exploratory surgery (looking in abdomen with a large incision). °TREATMENT  °The treatment will depend on the cause of the pain.  °· Many cases can be observed and treated at home. °· Over-the-counter medicines recommended by your caregiver. °· Prescription medicine. °· Antibiotics, for infection. °· Birth control pills, for painful periods or for ovulation pain. °· Hormone treatment, for endometriosis. °· Nerve blocking injections. °· Physical therapy. °· Antidepressants. °· Counseling with a psychologist or psychiatrist. °· Minor or major surgery. °HOME CARE INSTRUCTIONS  °· Do not take laxatives, unless directed by your caregiver. °· Take over-the-counter pain medicine only if ordered by your caregiver. Do not take aspirin because it can cause an upset stomach or bleeding. °· Try a clear liquid diet (broth or water) as ordered by your caregiver. Slowly move to a bland diet, as tolerated, if the pain is related to the stomach or intestine. °· Have a thermometer and take your temperature several times a day, and record it. °· Bed rest and sleep, if it helps the pain. °· Avoid sexual intercourse, if it causes pain. °· Avoid stressful situations. °· Keep your follow-up appointments and tests, as your caregiver orders. °· If the pain does not go away with medicine or surgery, you may try: °¨ Acupuncture. °¨ Relaxation exercises (yoga, meditation). °¨ Group therapy. °¨ Counseling. °SEEK MEDICAL CARE IF:  °· You notice certain foods cause stomach pain. °· Your home care treatment is not helping your pain. °· You need stronger pain medicine. °· You want your IUD removed. °· You feel faint or   lightheaded. °· You develop nausea and vomiting. °· You develop a rash. °· You are having side effects or an allergy to your medicine. °SEEK IMMEDIATE MEDICAL CARE IF:  °· Your  pain does not go away or gets worse. °· You have a fever. °· Your pain is felt only in portions of the abdomen. The right side could possibly be appendicitis. The left lower portion of the abdomen could be colitis or diverticulitis. °· You are passing blood in your stools (bright red or black tarry stools, with or without vomiting). °· You have blood in your urine. °· You develop chills, with or without a fever. °· You pass out. °MAKE SURE YOU:  °· Understand these instructions. °· Will watch your condition. °· Will get help right away if you are not doing well or get worse. °Document Released: 02/19/2007 Document Revised: 09/08/2013 Document Reviewed: 03/11/2009 °[employer]® Patient Information ©2015 [employer]. This information is not intended to replace advice given to you by your health care provider. Make sure you discuss any questions you have with your health care provider. ° °

## 2014-07-02 LAB — URINE CULTURE
COLONY COUNT: NO GROWTH
CULTURE: NO GROWTH

## 2014-08-07 ENCOUNTER — Emergency Department (HOSPITAL_COMMUNITY)
Admission: EM | Admit: 2014-08-07 | Discharge: 2014-08-08 | Disposition: A | Discharge status: Home / Self Care | Payer: Medicaid Other | Attending: Emergency Medicine | Admitting: Emergency Medicine

## 2014-08-07 ENCOUNTER — Encounter (HOSPITAL_COMMUNITY): Payer: Self-pay | Admitting: *Deleted

## 2014-08-07 DIAGNOSIS — R197 Diarrhea, unspecified: Secondary | ICD-10-CM | POA: Insufficient documentation

## 2014-08-07 DIAGNOSIS — Z862 Personal history of diseases of the blood and blood-forming organs and certain disorders involving the immune mechanism: Secondary | ICD-10-CM | POA: Insufficient documentation

## 2014-08-07 DIAGNOSIS — R112 Nausea with vomiting, unspecified: Secondary | ICD-10-CM | POA: Insufficient documentation

## 2014-08-07 DIAGNOSIS — Z9889 Other specified postprocedural states: Secondary | ICD-10-CM | POA: Insufficient documentation

## 2014-08-07 LAB — CBC WITH DIFFERENTIAL/PLATELET
BASOS ABS: 0 10*3/uL (ref 0.0–0.1)
Basophils Relative: 0 % (ref 0–1)
Eosinophils Absolute: 0 10*3/uL (ref 0.0–0.7)
Eosinophils Relative: 0 % (ref 0–5)
HCT: 42.3 % (ref 36.0–46.0)
Hemoglobin: 13.2 g/dL (ref 12.0–15.0)
LYMPHS PCT: 12 % (ref 12–46)
Lymphs Abs: 1 10*3/uL (ref 0.7–4.0)
MCH: 25 pg — ABNORMAL LOW (ref 26.0–34.0)
MCHC: 31.2 g/dL (ref 30.0–36.0)
MCV: 80.1 fL (ref 78.0–100.0)
MONO ABS: 0.3 10*3/uL (ref 0.1–1.0)
Monocytes Relative: 3 % (ref 3–12)
NEUTROS ABS: 7 10*3/uL (ref 1.7–7.7)
Neutrophils Relative %: 85 % — ABNORMAL HIGH (ref 43–77)
PLATELETS: 314 10*3/uL (ref 150–400)
RBC: 5.28 MIL/uL — ABNORMAL HIGH (ref 3.87–5.11)
RDW: 14.5 % (ref 11.5–15.5)
WBC: 8.3 10*3/uL (ref 4.0–10.5)

## 2014-08-07 LAB — URINE MICROSCOPIC-ADD ON

## 2014-08-07 LAB — COMPREHENSIVE METABOLIC PANEL
ALT: 23 U/L (ref 0–35)
AST: 23 U/L (ref 0–37)
Albumin: 3.9 g/dL (ref 3.5–5.2)
Alkaline Phosphatase: 88 U/L (ref 39–117)
Anion gap: 4 — ABNORMAL LOW (ref 5–15)
BUN: 10 mg/dL (ref 6–23)
CO2: 30 mmol/L (ref 19–32)
CREATININE: 0.74 mg/dL (ref 0.50–1.10)
Calcium: 9.1 mg/dL (ref 8.4–10.5)
Chloride: 100 mmol/L (ref 96–112)
GFR calc Af Amer: >90 mL/min (ref >90)
Glucose, Bld: 111 mg/dL — ABNORMAL HIGH (ref 70–99)
Potassium: 3.8 mmol/L (ref 3.5–5.1)
Sodium: 134 mmol/L — ABNORMAL LOW (ref 135–145)
Total Bilirubin: 0.4 mg/dL (ref 0.3–1.2)
Total Protein: 8.6 g/dL — ABNORMAL HIGH (ref 6.0–8.3)

## 2014-08-07 LAB — URINALYSIS, ROUTINE W REFLEX MICROSCOPIC
BILIRUBIN URINE: NEGATIVE
Glucose, UA: NEGATIVE mg/dL
Hgb urine dipstick: NEGATIVE
Ketones, ur: 15 mg/dL — AB
Leukocytes, UA: NEGATIVE
Nitrite: NEGATIVE
Protein, ur: 30 mg/dL — AB
Specific Gravity, Urine: 1.024 (ref 1.005–1.030)
UROBILINOGEN UA: 0.2 mg/dL (ref 0.0–1.0)
pH: 6 (ref 5.0–8.0)

## 2014-08-07 LAB — LIPASE, BLOOD: LIPASE: 44 U/L (ref 11–59)

## 2014-08-07 MED ORDER — PROMETHAZINE HCL 25 MG/ML IJ SOLN
25.0000 mg | Freq: Once | INTRAMUSCULAR | Status: AC
  Administered 2014-08-07: 25 mg via INTRAVENOUS
  Filled 2014-08-07: qty 1

## 2014-08-07 MED ORDER — MORPHINE SULFATE 4 MG/ML IJ SOLN
4.0000 mg | Freq: Once | INTRAMUSCULAR | Status: AC
  Administered 2014-08-07: 4 mg via INTRAVENOUS
  Filled 2014-08-07: qty 1

## 2014-08-07 MED ORDER — ONDANSETRON 4 MG PO TBDP
8.0000 mg | ORAL_TABLET | Freq: Once | ORAL | Status: AC
  Administered 2014-08-07: 8 mg via ORAL
  Filled 2014-08-07: qty 2

## 2014-08-07 MED ORDER — SODIUM CHLORIDE 0.9 % IV BOLUS (SEPSIS)
1000.0000 mL | Freq: Once | INTRAVENOUS | Status: AC
  Administered 2014-08-07: 1000 mL via INTRAVENOUS

## 2014-08-07 NOTE — ED Notes (Signed)
The pt is c/o abd cramps vomiting and diarrhea since 0600am today.  lmp last week

## 2014-08-07 NOTE — ED Provider Notes (Signed)
CSN: [id number]     Arrival date & time 08/07/14  1726 History   First MD Initiated Contact with Patient 08/07/14 2140     Chief Complaint  Patient presents with  . Abdominal Pain     (Consider location/radiation/quality/duration/timing/severity/associated sxs/prior Treatment) HPI Comments: Patient is a 29 year old female past medical history significant for anemia presented to the emergency department for evaluation of generalized abdominal cramping pain with associated nausea, multiple episodes of nonbloody nonbilious emesis, multiple episodes of nonbloody diarrhea that began this morning around 6 AM. No modifying factors identified. No medication tried prior to arrival. No sick contacts noted. Abdominal surgical history includes appendectomy. Denies any fevers. No recent trips or travel outside the [place].   Patient is a 29 y.o. female presenting with abdominal pain.  Abdominal Pain Associated symptoms: diarrhea, nausea and vomiting     Past Medical History  Diagnosis Date  . Tubal pregnancy 2011  . History of anemia   . Hx of appendectomy   . H/O dilation and curettage 2010   Past Surgical History  Procedure Laterality Date  . Appendectomy  age 25  . Intrauterine device (iud) insertion      mirena inserted 02-06-11? pt thinks 2011 instead  . Wisdom tooth extraction     Family History  Problem Relation Age of Onset  . Hyperlipidemia Mother   . Hyperlipidemia Father   . Stroke Maternal Grandfather   . Hyperlipidemia Brother    History  Substance Use Topics  . Smoking status: Never Smoker   . Smokeless tobacco: Never Used  . Alcohol Use: Yes     Comment: occ   OB History    Gravida Para Term Preterm AB TAB SAB Ectopic Multiple Living   4 2 2  2  2   0 2     Review of Systems  Gastrointestinal: Positive for nausea, vomiting, abdominal pain and diarrhea.  All other systems reviewed and are negative.     Allergies  Review of patient's allergies indicates no known  allergies.  Home Medications   Prior to Admission medications   Medication Sig Start Date End Date Taking? Authorizing Provider  ibuprofen (ADVIL,MOTRIN) 600 MG tablet Take 1 tablet (600 mg total) by mouth every 6 (six) hours as needed. Patient taking differently: Take 600 mg by mouth every 6 (six) hours as needed for moderate pain.  06/12/14  Yes Donnel Saxon, CNM  docusate sodium (COLACE) 100 MG capsule Take 100 mg by mouth daily as needed for mild constipation.    Historical Provider, MD  ondansetron (ZOFRAN ODT) 4 MG disintegrating tablet Take 1 tablet (4 mg total) by mouth every 8 (eight) hours as needed for nausea or vomiting. 08/08/14   Baron Sane, PA-C  Prenatal Vit-Fe Fumarate-FA (MULTIVITAMIN-PRENATAL) 27-0.8 MG TABS tablet Take 1 tablet by mouth daily at 12 noon.    Historical Provider, MD   BP 101/53 mmHg  Pulse 76  Temp(Src) 97.8 F (36.6 C) (Oral)  Resp 18  SpO2 98% Physical Exam  Constitutional: She is oriented to person, place, and time. She appears well-developed and well-nourished. No distress.  HENT:  Head: Normocephalic and atraumatic.  Right Ear: External ear normal.  Left Ear: External ear normal.  Nose: Nose normal.  Mouth/Throat: Uvula is midline and oropharynx is clear and moist. Mucous membranes are dry.  Eyes: Conjunctivae are normal.  Neck: Neck supple.  Cardiovascular: Normal rate, regular rhythm and normal heart sounds.   Pulmonary/Chest: Effort normal and breath sounds normal. No respiratory  distress.  Abdominal: Soft. Bowel sounds are normal. She exhibits no distension. There is generalized tenderness. There is no rigidity, no rebound and no guarding.  Neurological: She is alert and oriented to person, place, and time.  Skin: Skin is warm and dry. She is not diaphoretic.  Nursing note and vitals reviewed.   ED Course  Procedures (including critical care time) Medications  ondansetron (ZOFRAN-ODT) disintegrating tablet 8 mg (8 mg Oral Given  08/07/14 1830)  sodium chloride 0.9 % bolus 1,000 mL (0 mLs Intravenous Stopped 08/08/14 0056)  morphine 4 MG/ML injection 4 mg (4 mg Intravenous Given 08/07/14 2342)  promethazine (PHENERGAN) injection 25 mg (25 mg Intravenous Given 08/07/14 2342)    Labs Review Labs Reviewed  CBC WITH DIFFERENTIAL/PLATELET - Abnormal; Notable for the following:    RBC 5.28 (*)    MCH 25.0 (*)    Neutrophils Relative % 85 (*)    All other components within normal limits  COMPREHENSIVE METABOLIC PANEL - Abnormal; Notable for the following:    Sodium 134 (*)    Glucose, Bld 111 (*)    Total Protein 8.6 (*)    Anion gap 4 (*)    All other components within normal limits  URINALYSIS, ROUTINE W REFLEX MICROSCOPIC - Abnormal; Notable for the following:    Color, Urine AMBER (*)    APPearance CLOUDY (*)    Ketones, ur 15 (*)    Protein, ur 30 (*)    All other components within normal limits  URINE MICROSCOPIC-ADD ON - Abnormal; Notable for the following:    Squamous Epithelial / LPF FEW (*)    All other components within normal limits  LIPASE, BLOOD    Imaging Review No results found.   EKG Interpretation None      MDM   Final diagnoses:  Nausea vomiting and diarrhea    Filed Vitals:   08/08/14 0057  BP: 101/53  Pulse: 76  Temp: 97.8 F (36.6 C)  Resp: 18   Afebrile, NAD, non-toxic appearing, AAOx4.  Patient with symptoms consistent with viral gastroenteritis.  Vitals are stable, no fever.  No signs of dehydration, tolerating PO fluids > 6 oz.  Lungs are clear.  No focal abdominal pain, no concern for appendicitis, cholecystitis, pancreatitis, ruptured viscus, UTI, kidney stone, or any other abdominal etiology.  Supportive therapy indicated with return if symptoms worsen.  Patient counseled. Patient d/w with Dr. Reather Converse, agrees with plan.      Baron Sane, PA-C 08/08/14 6568  Elnora Morrison, MD 08/09/14 [phone]

## 2014-08-07 NOTE — ED Notes (Signed)
She just delovered feb

## 2014-08-08 MED ORDER — ONDANSETRON 4 MG PO TBDP: 4.0000 mg | ORAL_TABLET | Freq: Three times a day (TID) | ORAL | Status: AC | PRN

## 2014-08-08 NOTE — ED Notes (Signed)
Provided ginger ale as fluid challenge.

## 2014-08-08 NOTE — Discharge Instructions (Signed)
Please follow up with your primary care physician in 1-2 days. If you do not have one please call the [facility] number listed above. Please read all discharge instructions and return precautions.   Viral Gastroenteritis Viral gastroenteritis is also known as stomach flu. This condition affects the stomach and intestinal tract. It can cause sudden diarrhea and vomiting. The illness typically lasts 3 to 8 days. Most people develop an immune response that eventually gets rid of the virus. While this natural response develops, the virus can make you quite ill. CAUSES  Many different viruses can cause gastroenteritis, such as rotavirus or noroviruses. You can catch one of these viruses by consuming contaminated food or water. You may also catch a virus by sharing utensils or other personal items with an infected person or by touching a contaminated surface. SYMPTOMS  The most common symptoms are diarrhea and vomiting. These problems can cause a severe loss of body fluids (dehydration) and a body salt (electrolyte) imbalance. Other symptoms may include:  Fever.  Headache.  Fatigue.  Abdominal pain. DIAGNOSIS  Your caregiver can usually diagnose viral gastroenteritis based on your symptoms and a physical exam. A stool sample may also be taken to test for the presence of viruses or other infections. TREATMENT  This illness typically goes away on its own. Treatments are aimed at rehydration. The most serious cases of viral gastroenteritis involve vomiting so severely that you are not able to keep fluids down. In these cases, fluids must be given through an intravenous line (IV). HOME CARE INSTRUCTIONS   Drink enough fluids to keep your urine clear or pale yellow. Drink small amounts of fluids frequently and increase the amounts as tolerated.  Ask your caregiver for specific rehydration instructions.  Avoid:  Foods high in sugar.  Alcohol.  Carbonated  drinks.  Tobacco.  Juice.  Caffeine drinks.  Extremely hot or cold fluids.  Fatty, greasy foods.  Too much intake of anything at one time.  Dairy products until 24 to 48 hours after diarrhea stops.  You may consume probiotics. Probiotics are active cultures of beneficial bacteria. They may lessen the amount and number of diarrheal stools in adults. Probiotics can be found in yogurt with active cultures and in supplements.  Wash your hands well to avoid spreading the virus.  Only take over-the-counter or prescription medicines for pain, discomfort, or fever as directed by your caregiver. Do not give aspirin to children. Antidiarrheal medicines are not recommended.  Ask your caregiver if you should continue to take your regular prescribed and over-the-counter medicines.  Keep all follow-up appointments as directed by your caregiver. SEEK IMMEDIATE MEDICAL CARE IF:   You are unable to keep fluids down.  You do not urinate at least once every 6 to 8 hours.  You develop shortness of breath.  You notice blood in your stool or vomit. This may look like coffee grounds.  You have abdominal pain that increases or is concentrated in one small area (localized).  You have persistent vomiting or diarrhea.  You have a fever.  The patient is a child younger than 3 months, and he or she has a fever.  The patient is a child older than 3 months, and he or she has a fever and persistent symptoms.  The patient is a child older than 3 months, and he or she has a fever and symptoms suddenly get worse.  The patient is a baby, and he or she has no tears when  crying. MAKE SURE YOU:   Understand these instructions.  Will watch your condition.  Will get help right away if you are not doing well or get worse. Document Released: 04/24/2005 Document Revised: 07/17/2011 Document Reviewed: 02/08/2011 [facility] Patient Information 2015 [place], [place]. This information is not intended to replace  advice given to you by your health care provider. Make sure you discuss any questions you have with your health care provider.

## 2014-08-08 NOTE — ED Notes (Signed)
Patient reports no nausea after po challenge.

## 2015-03-08 ENCOUNTER — Encounter (HOSPITAL_COMMUNITY): Payer: Self-pay | Admitting: *Deleted

## 2015-03-08 ENCOUNTER — Emergency Department (INDEPENDENT_AMBULATORY_CARE_PROVIDER_SITE_OTHER)
Admission: EM | Admit: 2015-03-08 | Discharge: 2015-03-08 | Disposition: A | Discharge status: Home / Self Care | Payer: Self-pay | Source: Home / Self Care | Attending: Family Medicine | Admitting: Family Medicine

## 2015-03-08 DIAGNOSIS — K2100 Gastro-esophageal reflux disease with esophagitis, without bleeding: Secondary | ICD-10-CM

## 2015-03-08 MED ORDER — SUCRALFATE 1 GM/10ML PO SUSP: 1.0000 g | Freq: Three times a day (TID) | ORAL | Status: AC

## 2015-03-08 MED ORDER — GI COCKTAIL ~~LOC~~
30.0000 mL | Freq: Once | ORAL | Status: AC
Start: 2015-03-08 — End: 2015-03-08
  Administered 2015-03-08: 30 mL via ORAL

## 2015-03-08 MED ORDER — ESOMEPRAZOLE MAGNESIUM 40 MG PO CPDR: 40.0000 mg | DELAYED_RELEASE_CAPSULE | Freq: Every day | ORAL | Status: AC

## 2015-03-08 MED ORDER — GI COCKTAIL ~~LOC~~
ORAL | Status: AC
  Filled 2015-03-08: qty 30

## 2015-03-08 NOTE — ED Provider Notes (Signed)
CSN: [id number]     Arrival date & time 03/08/15  1311 History   First MD Initiated Contact with Patient 03/08/15 1418     Chief Complaint  Patient presents with  . Gastroesophageal Reflux   (Consider location/radiation/quality/duration/timing/severity/associated sxs/prior Treatment) Patient is a 29 y.o. female presenting with GERD. The history is provided by the patient.  Gastroesophageal Reflux This is a new problem. The current episode started more than 1 week ago. The problem has not changed since onset.Pertinent negatives include no chest pain, no abdominal pain, no headaches and no shortness of breath. The symptoms are aggravated by swallowing.    Past Medical History  Diagnosis Date  . Tubal pregnancy 2011  . History of anemia   . Hx of appendectomy   . H/O dilation and curettage 2010   Past Surgical History  Procedure Laterality Date  . Appendectomy  age 81  . Intrauterine device (iud) insertion      mirena inserted 02-06-11? pt thinks 2011 instead  . Wisdom tooth extraction     Family History  Problem Relation Age of Onset  . Hyperlipidemia Mother   . Hyperlipidemia Father   . Stroke Maternal Grandfather   . Hyperlipidemia Brother    Social History  Substance Use Topics  . Smoking status: Never Smoker   . Smokeless tobacco: Never Used  . Alcohol Use: Yes     Comment: occ   OB History    Gravida Para Term Preterm AB TAB SAB Ectopic Multiple Living   4 2 2  2  2   0 2     Review of Systems  Constitutional: Negative for fever and chills.  HENT: Negative.   Respiratory: Negative.  Negative for shortness of breath.   Cardiovascular: Negative.  Negative for chest pain.  Gastrointestinal: Negative for nausea, vomiting and abdominal pain.       Dysphagia sx.  Neurological: Negative for headaches.  All other systems reviewed and are negative.   Allergies  Review of patient's allergies indicates no known allergies.  Home Medications   Prior to Admission  medications   Medication Sig Start Date End Date Taking? Authorizing Provider  docusate sodium (COLACE) 100 MG capsule Take 100 mg by mouth daily as needed for mild constipation.    Historical Provider, MD  esomeprazole (NEXIUM) 40 MG capsule Take 1 capsule (40 mg total) by mouth daily. 03/08/15   Billy Fischer, MD  ibuprofen (ADVIL,MOTRIN) 600 MG tablet Take 1 tablet (600 mg total) by mouth every 6 (six) hours as needed. Patient taking differently: Take 600 mg by mouth every 6 (six) hours as needed for moderate pain.  06/12/14   Donnel Saxon, CNM  ondansetron (ZOFRAN ODT) 4 MG disintegrating tablet Take 1 tablet (4 mg total) by mouth every 8 (eight) hours as needed for nausea or vomiting. 08/08/14   Baron Sane, PA-C  Prenatal Vit-Fe Fumarate-FA (MULTIVITAMIN-PRENATAL) 27-0.8 MG TABS tablet Take 1 tablet by mouth daily at 12 noon.    Historical Provider, MD  sucralfate (CARAFATE) 1 GM/10ML suspension Take 10 mLs (1 g total) by mouth 4 (four) times daily -  with meals and at bedtime. 03/08/15   Billy Fischer, MD   Meds Ordered and Administered this Visit   Medications  gi cocktail (Maalox,Lidocaine,Donnatal) (30 mLs Oral Given 03/08/15 1528)    BP 110/77 mmHg  Pulse 82  Temp(Src) 98.3 F (36.8 C) (Oral)  Resp 16  SpO2 97% No data found.   Physical Exam  Constitutional: She  is oriented to person, place, and time. She appears well-developed and well-nourished. No distress.  HENT:  Right Ear: External ear normal.  Left Ear: External ear normal.  Mouth/Throat: Oropharynx is clear and moist.  Neck: Normal range of motion. Neck supple.  Pulmonary/Chest: Effort normal and breath sounds normal.  Abdominal: Soft. Bowel sounds are normal. There is no tenderness.  Lymphadenopathy:    She has no cervical adenopathy.  Neurological: She is alert and oriented to person, place, and time.  Skin: Skin is warm and dry.  Nursing note and vitals reviewed.   ED Course  Procedures (including  critical care time)  Labs Review Labs Reviewed - No data to display  Imaging Review No results found.   Visual Acuity Review  Right Eye Distance:   Left Eye Distance:   Bilateral Distance:    Right Eye Near:   Left Eye Near:    Bilateral Near:         MDM   1. Gastroesophageal reflux disease with esophagitis    Sx improved at time of d/c with meds.   Billy Fischer, MD 03/08/15 [phone]

## 2015-03-08 NOTE — ED Notes (Signed)
Pt  Reports  Symptoms  Of       Acid  Reflux      Burning  In  Epigastric        Area   X  1  Week

## 2015-03-08 NOTE — Discharge Instructions (Signed)
Use medicine as prescribed, see gi doctor listed if further problems.

## 2016-01-15 IMAGING — US US OB COMP LESS 14 WK
1 series · 14 of 28 positions shown · non-contrast
Comparison: None.

CLINICAL DATA: Pain.

EXAM:
OBSTETRIC <14 WK ULTRASOUND
TECHNIQUE: Transabdominal ultrasound was performed for evaluation of the
gestation as well as the maternal uterus and adnexal regions.

[Series 1: us ob comp less 14 wk · 0.24mm/px · 14 of 33 slices shown]
[im 2/33]
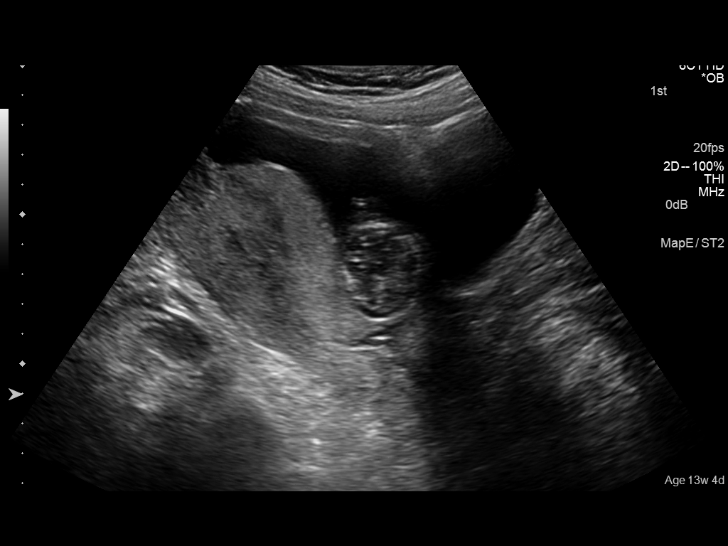
[im 4/33]
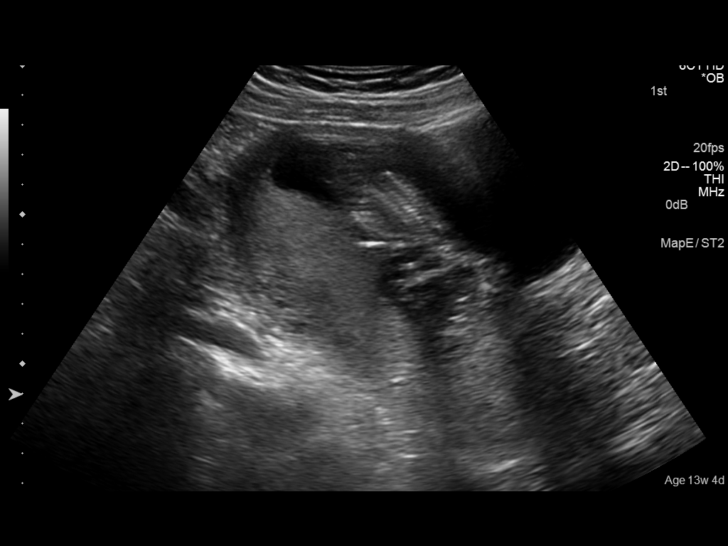
[im 6/33]
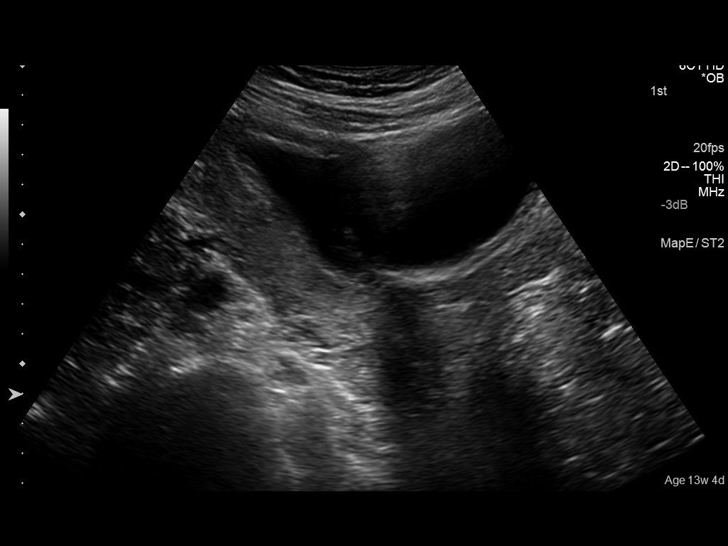
[im 9/33]
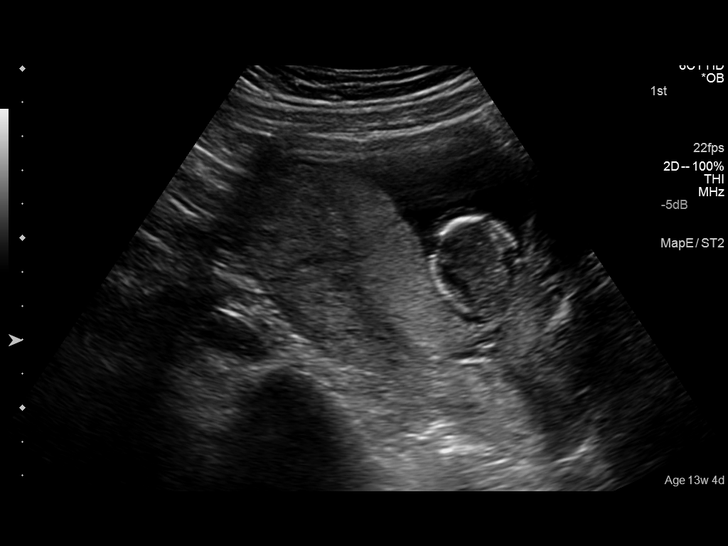
[im 11/33]
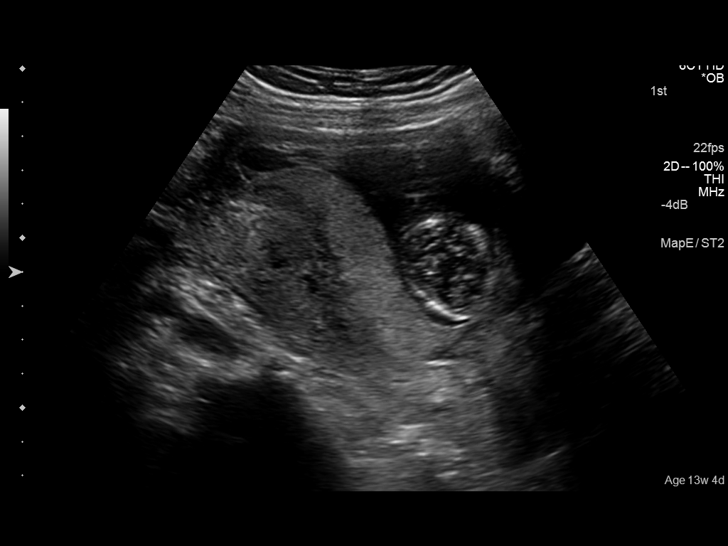
[im 14/33]
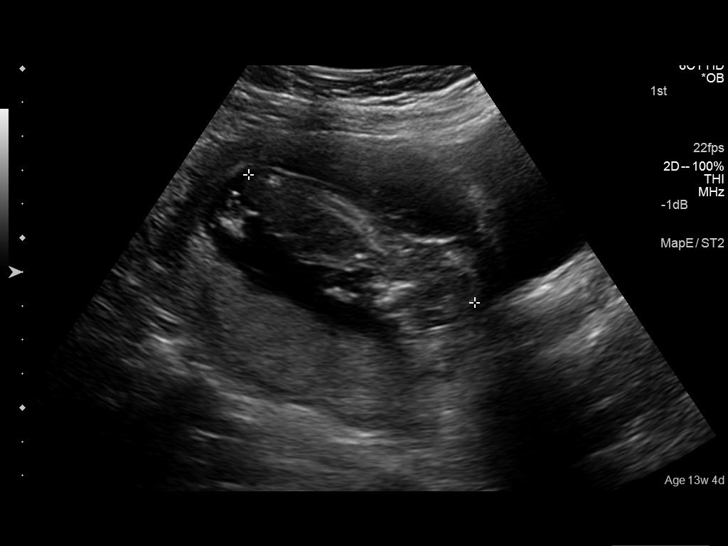
[im 16/33]
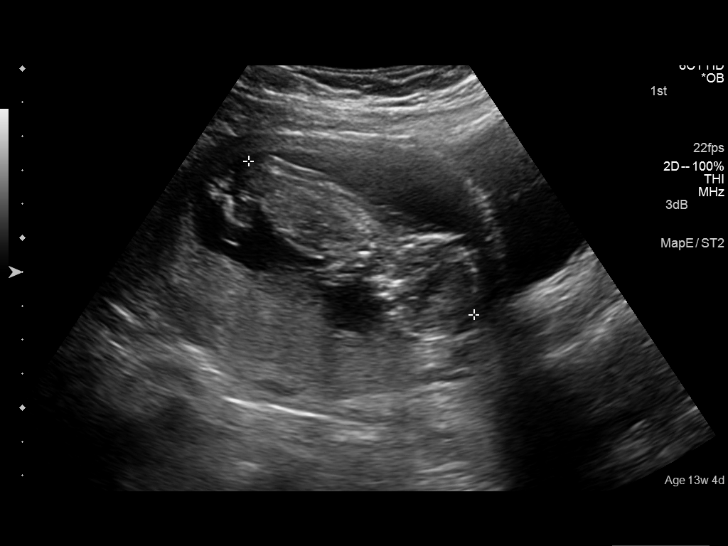
[im 18/33]
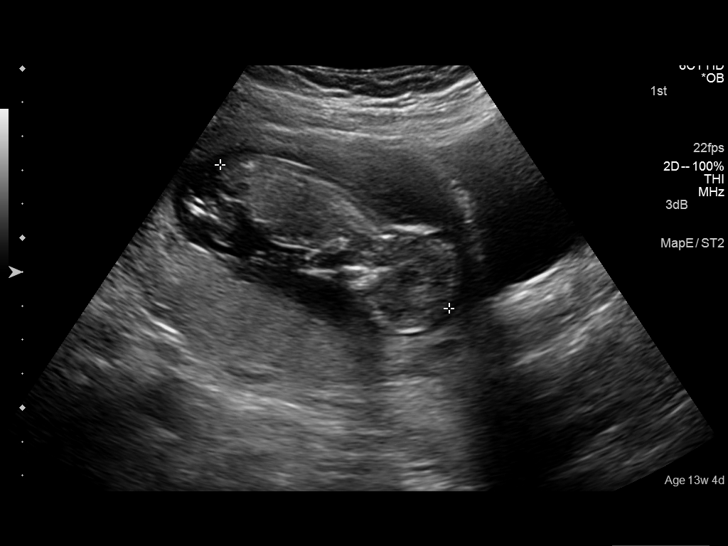
[im 21/33]
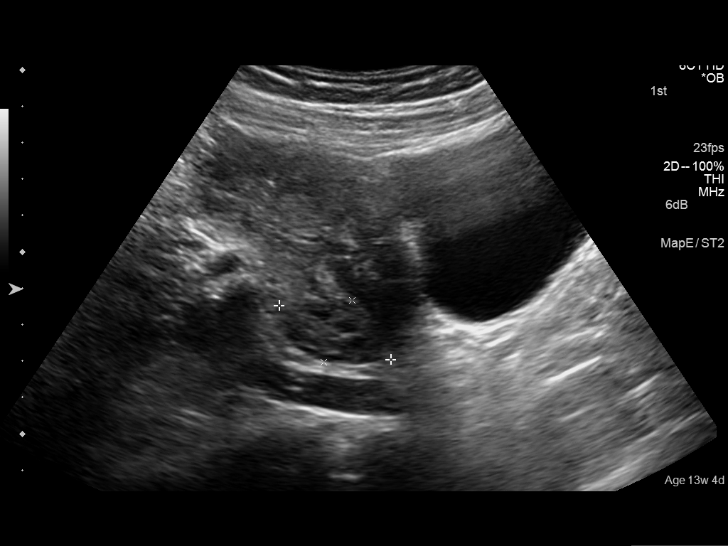
[im 23/33]
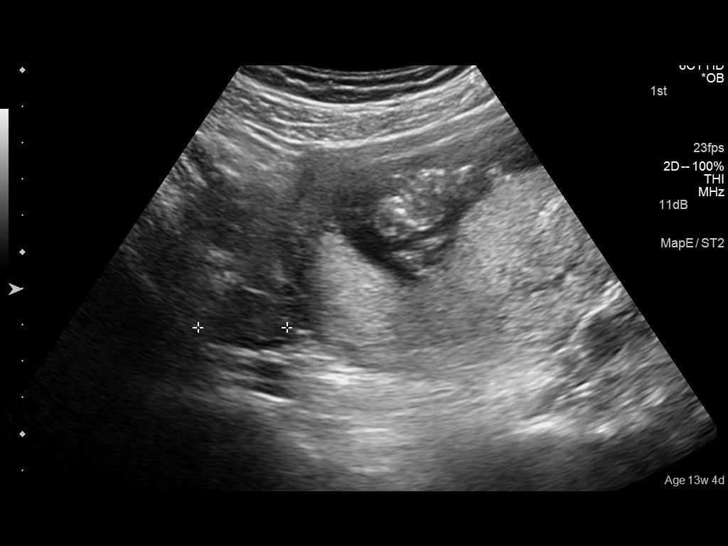
[im 25/33]
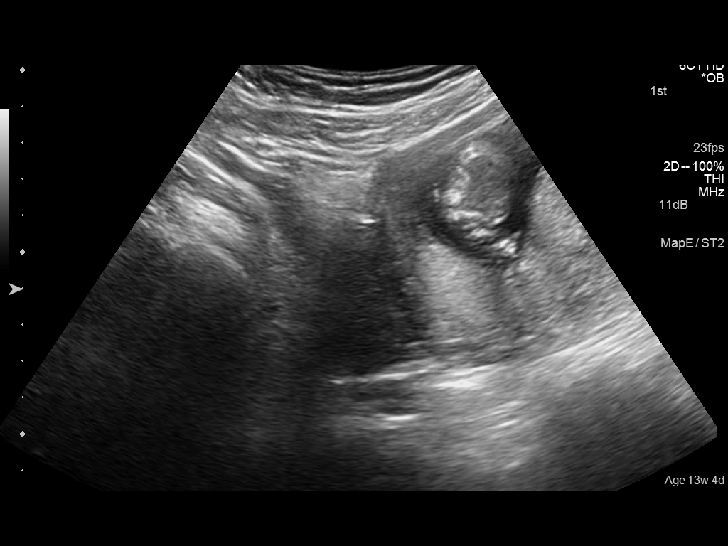
[im 28/33]
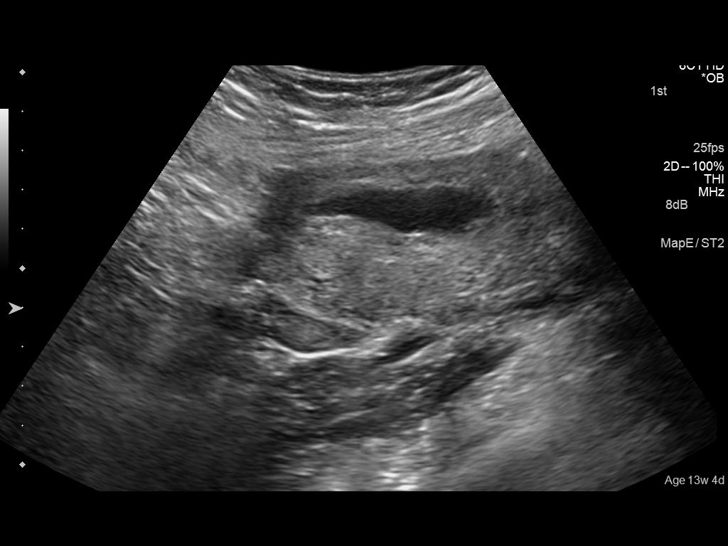
[im 30/33]
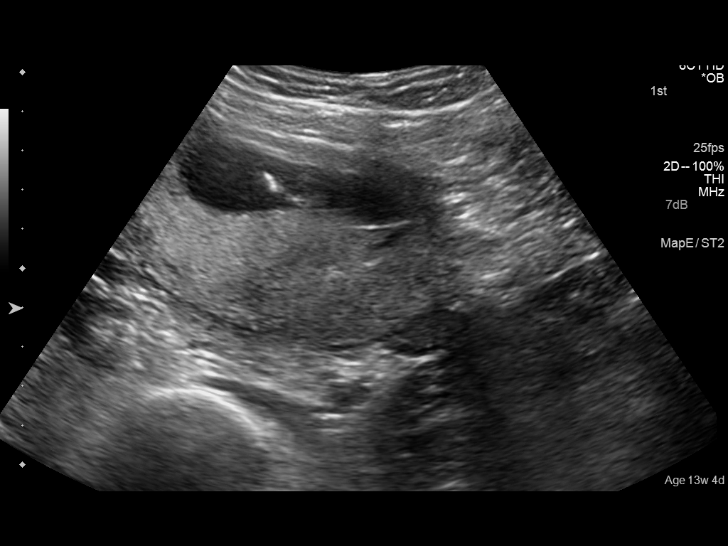
[im 33/33]
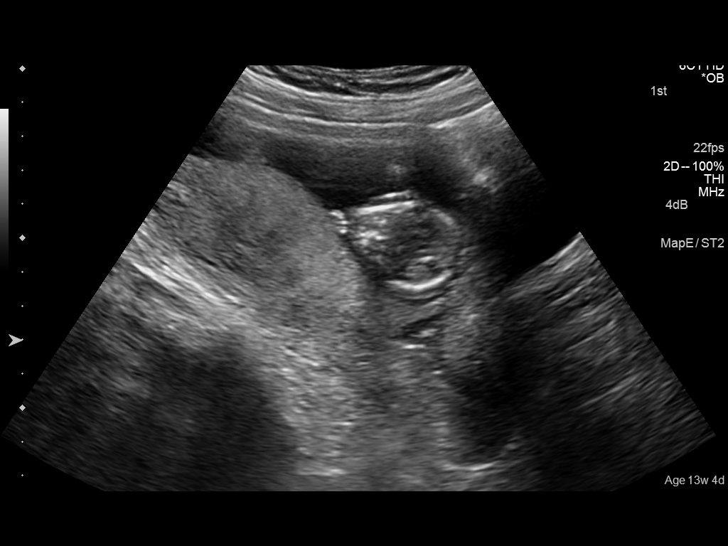

[14 of 28 positions shown; findings below may reference images not displayed]

FINDINGS: Intrauterine gestational sac: Visualized/normal in shape.

Yolk sac:  Not visualized.

Embryo:  Visualized.

Cardiac Activity: Visualized.

Heart Rate: 173 bpm

CRL:   79  Mm   13 w 6 d                  US EDC: 06/30/2014

Maternal uterus/adnexae:  Questionable placenta previa noted.
IMPRESSION: 1. Single viable intrauterine pregnancy at 13 weeks 6 days.
2. Questionable placenta previa. This should be followed on
subsequent obstetrical ultrasounds.

## 2016-08-06 IMAGING — CT CT ANGIO CHEST
1 of 2 series · 19 of 32 positions shown · IV contrast (omnipaque)
Comparison: Chest radiograph July 01, 2014

CLINICAL DATA: Chest pain with inspiration

EXAM:
CT ANGIOGRAPHY CHEST WITH CONTRAST
TECHNIQUE: Multidetector CT imaging of the chest was performed using the
standard protocol during bolus administration of intravenous
contrast. Multiplanar CT image reconstructions and MIPs were
obtained to evaluate the vascular anatomy.
CONTRAST:  100mL OMNIPAQUE IOHEXOL 350 MG/ML SOLN

[Series 8: (person_name) thins · axial · 0.59mm/px · z∈[-92,+132]mm · 19 of 355 slices shown]
[im 18/355  lung]
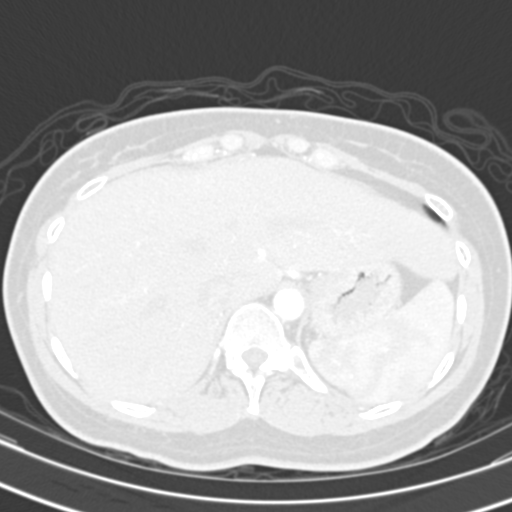
[im 36/355  mediastinal]
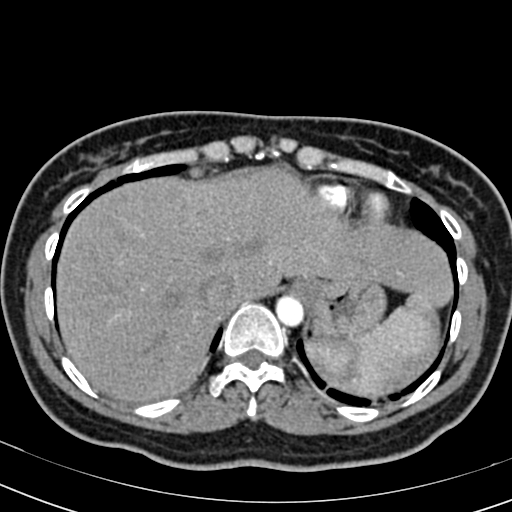
[im 54/355  lung]
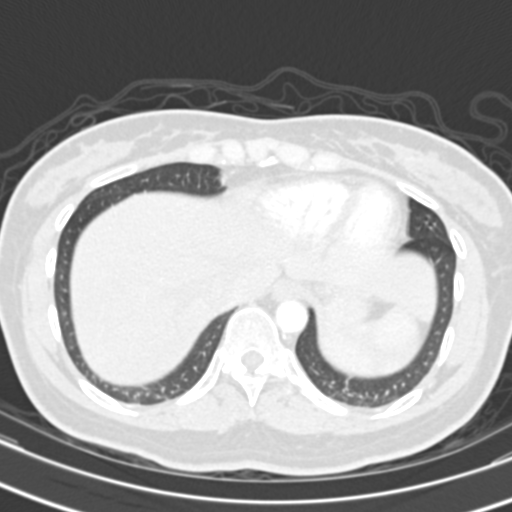
[im 89/355  mediastinal]
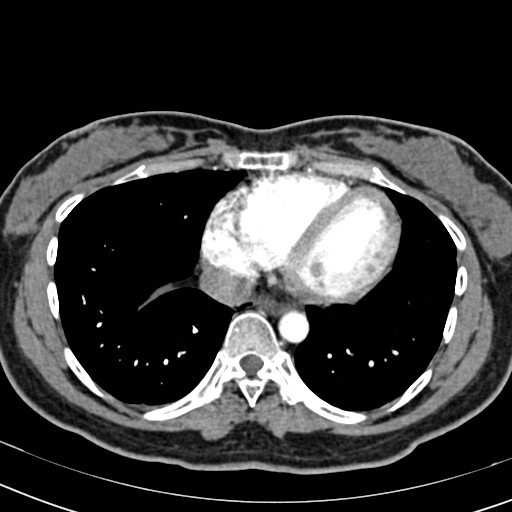
[im 107/355  lung]
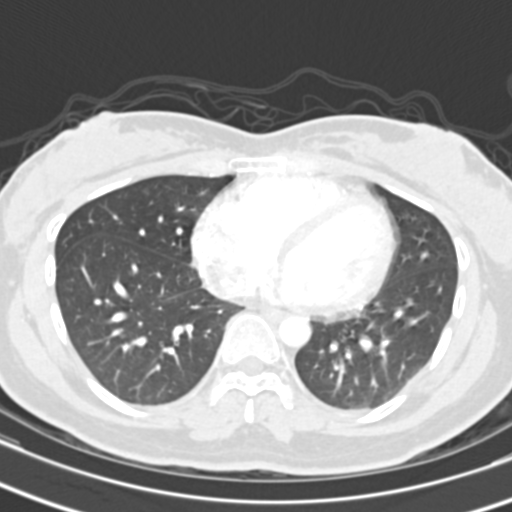
[im 119/355  mediastinal]
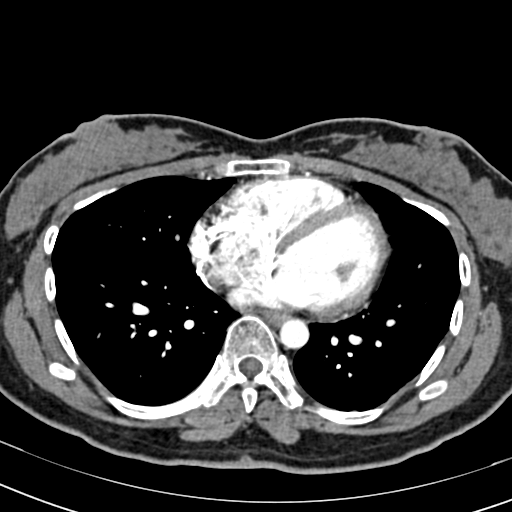
[im 124/355  lung]
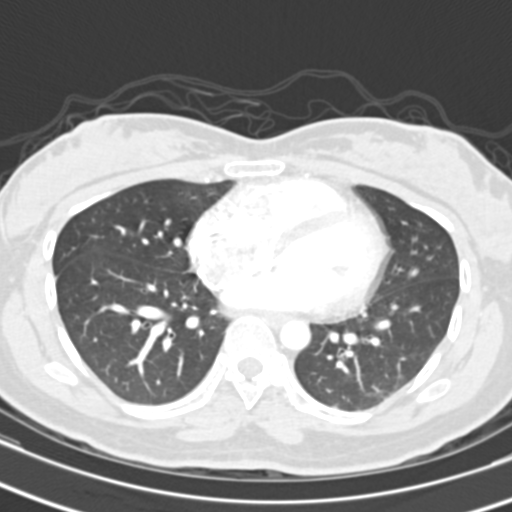
[im 142/355  mediastinal]
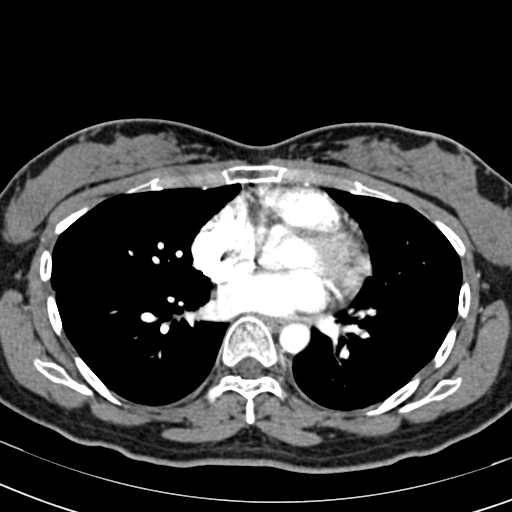
[im 160/355  lung]
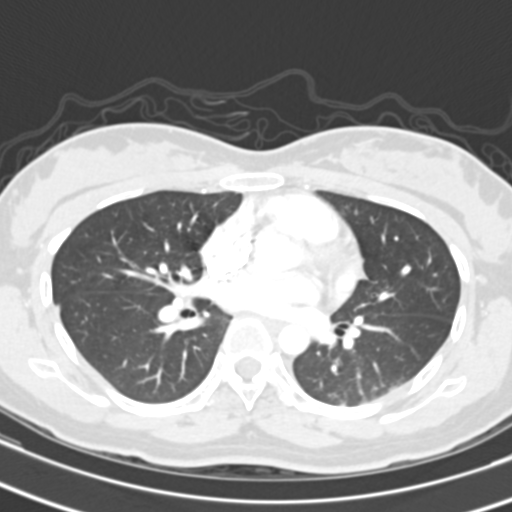
[im 178/355  mediastinal]
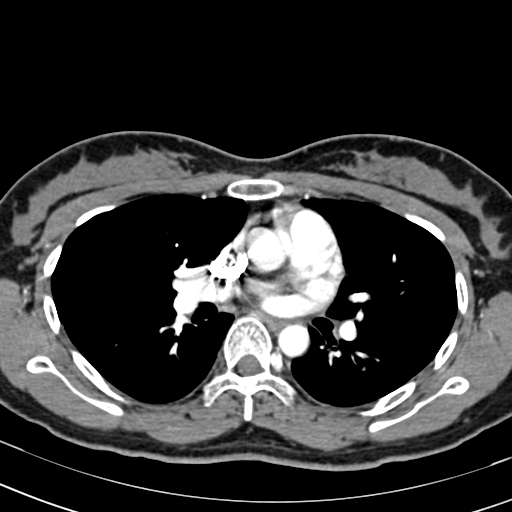
[im 195/355  lung]
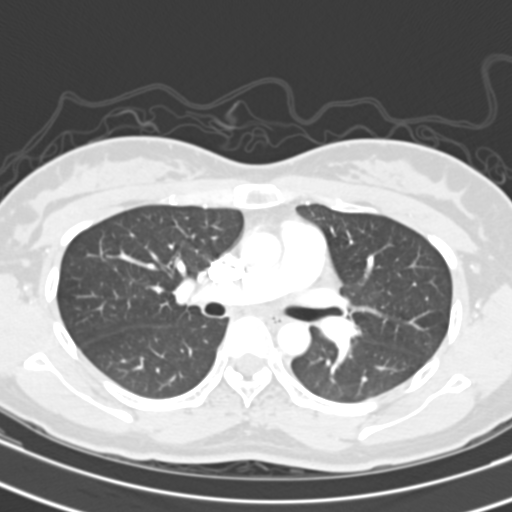
[im 213/355  mediastinal]
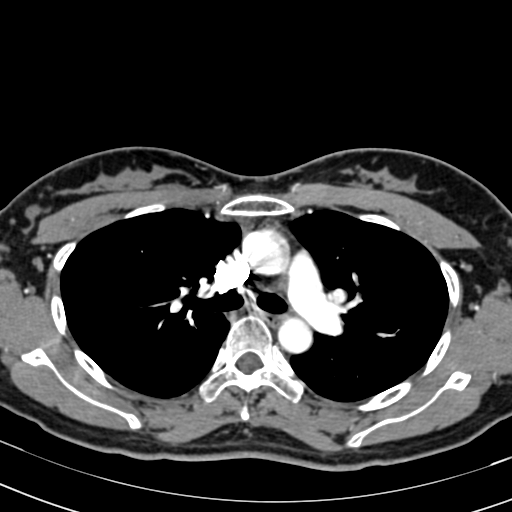
[im 231/355  lung]
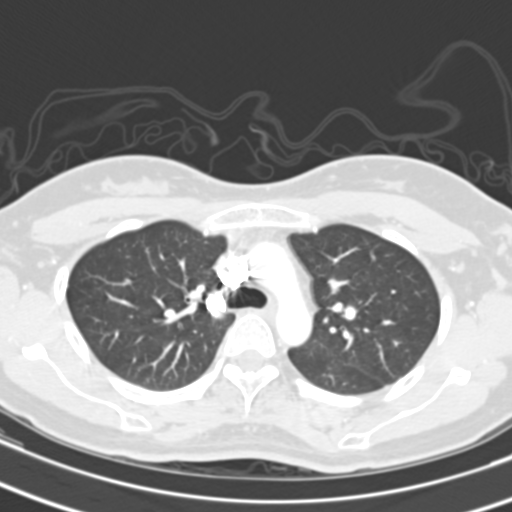
[im 237/355  mediastinal]
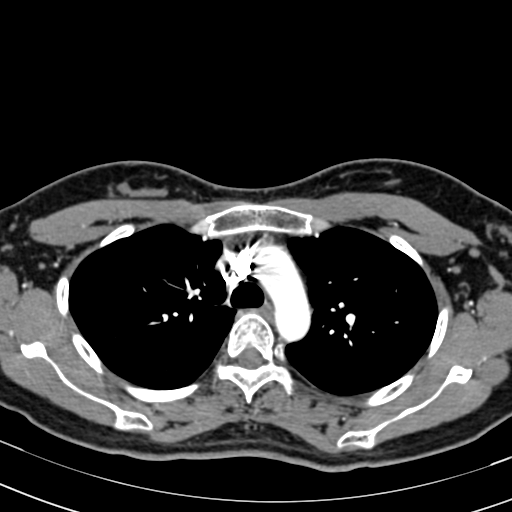
[im 248/355  lung]
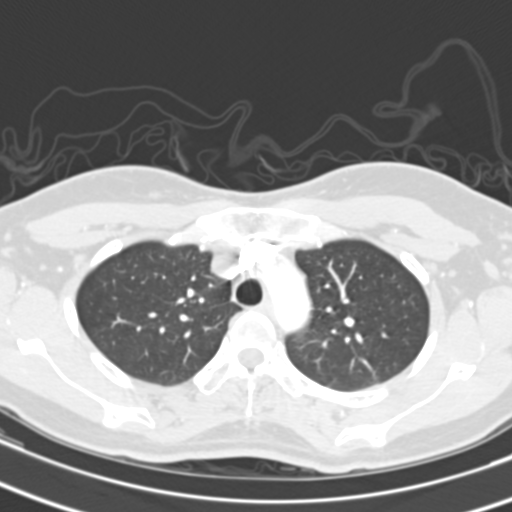
[im 266/355  mediastinal]
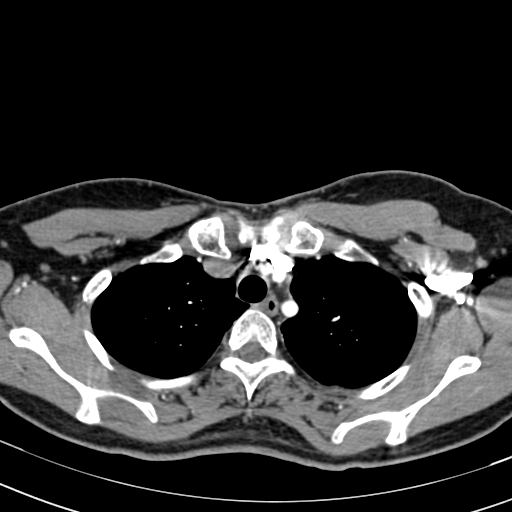
[im 301/355  lung]
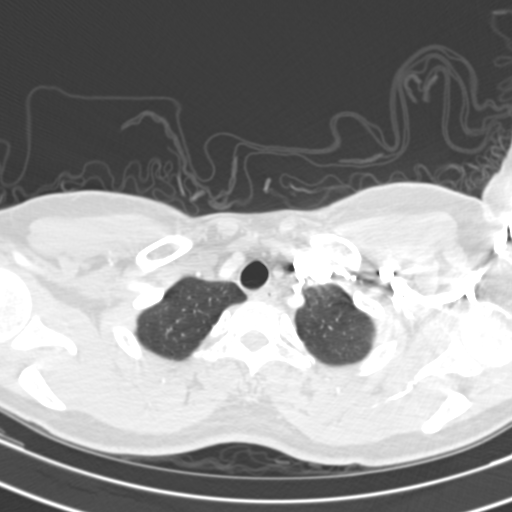
[im 319/355  mediastinal]
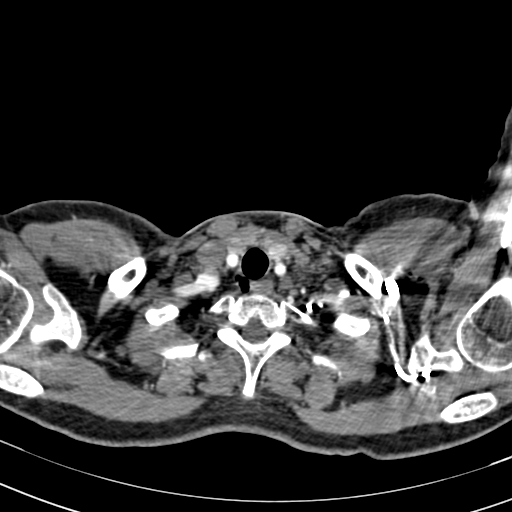
[im 337/355  lung]
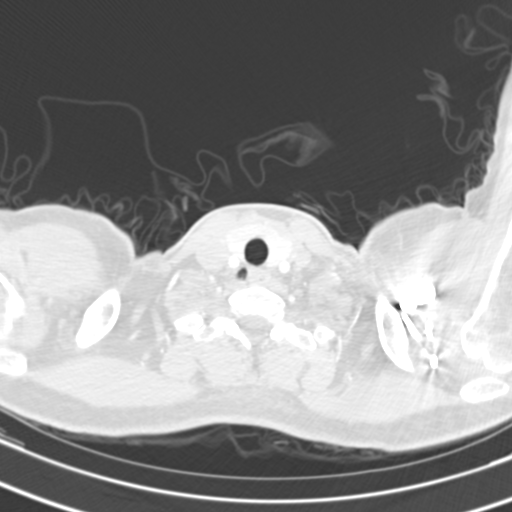

[19 of 32 positions shown; findings below may reference images not displayed]

FINDINGS: There is no demonstrable pulmonary embolus. There is no thoracic
aortic aneurysm or dissection.

There is mild dependent atelectasis in the posterior lung bases.
Lungs elsewhere clear. There is no appreciable thoracic adenopathy.
The pericardium is not thickened.

In the visualized upper abdomen, there is mild hepatic steatosis.
There are no blastic or lytic bone lesions. Visualized thyroid
appears normal.

Review of the MIP images confirms the above findings.
IMPRESSION: No demonstrable pulmonary embolus. No edema or consolidation. No
apparent adenopathy. Mild hepatic steatosis.

## 2019-01-25 ENCOUNTER — Other Ambulatory Visit: Payer: Self-pay

## 2019-01-25 DIAGNOSIS — Z20822 Contact with and (suspected) exposure to covid-19: Secondary | ICD-10-CM

## 2019-01-27 ENCOUNTER — Telehealth: Payer: Self-pay | Admitting: General Practice

## 2019-01-27 LAB — NOVEL CORONAVIRUS, NAA: SARS-CoV-2, NAA: NOT DETECTED

## 2019-01-27 NOTE — Telephone Encounter (Signed)
Patient called in and informed of negative test results. Patient also made aware results are posted in mychart. Patient expressed understanding.

## 2019-02-05 ENCOUNTER — Ambulatory Visit: Payer: Self-pay

## 2019-02-05 NOTE — Telephone Encounter (Signed)
Patient called and says she was around her sister on 01/25/19, both went to be tested for covid and rode in the same car. She says she was around her sister for about an hour. She says on Monday, 01/27/19 her results came back negative and her sister's results were positive. She says on Wednesday, 01/29/19 she started with symptoms-diarrhea, headache, loss of taste/smell. She says on Monday the fever started. She says it was 104.1 on Monday, high of 103.7 Tuesday and today as high as 103.2 all while taking Tylenol 500 mg every 6 hours. She says she still has loss of taste/smell, headache, dizziness, cough. She denies SOB, however she says she has to catch her breath when coughing and talking a lot makes her cough. I advised her to do an e-visit since she's been experiencing symptoms for 7 days and let a provider advise on any treatment. I advised if he/she recommends covid testing to go to [facility] tomorrow between [phone], she verbalized understanding.  Reason for Disposition . Fever present > 3 days (72 hours)  Answer Assessment - Initial Assessment Questions 1. CLOSE CONTACT: "Who is the person with the confirmed or suspected COVID-19 infection that you were exposed to?"     Sister 2. PLACE of CONTACT: "Where were you when you were exposed to COVID-19?" (e.g., home, school, medical waiting room; which city?)     Car with sister 3. TYPE of CONTACT: "How much contact was there?" (e.g., sitting next to, live in same house, work in same office, same building)     Same car with sister 4. DURATION of CONTACT: "How long were you in contact with the COVID-19 patient?" (e.g., a few seconds, passed by person, a few minutes, live with the patient)     For about 1 hour 5. DATE of CONTACT: "When did you have contact with a COVID-19 patient?" (e.g., how many days ago)     01/25/19 6. TRAVEL: "Have you traveled out of the country recently?" If so, "When and where?"     * Also ask about out-of-state travel,  since the CDC has identified some high-risk cities for community spread in the [place].     * Note: Travel becomes less relevant if there is widespread community transmission where the patient lives.     No 7. COMMUNITY SPREAD: "Are there lots of cases of COVID-19 (community spread) where you live?" (See public health department website, if unsure)       No 8. SYMPTOMS: "Do you have any symptoms?" (e.g., fever, cough, breathing difficulty)     Fever, cough, headache, dizzy 9. PREGNANCY OR POSTPARTUM: "Is there any chance you are pregnant?" "When was your last menstrual period?" "Did you deliver in the last 2 weeks?"     No; LMP 2 days ago 10. HIGH RISK: "Do you have any heart or lung problems? Do you have a weak immune system?" (e.g., CHF, COPD, asthma, HIV positive, chemotherapy, renal failure, diabetes mellitus, sickle cell anemia)      No  Answer Assessment - Initial Assessment Questions 1. COVID-19 DIAGNOSIS: "Who made your Coronavirus (COVID-19) diagnosis?" "Was it confirmed by a positive lab test?" If not diagnosed by a HCP, ask "Are there lots of cases (community spread) where you live?" (See public health department website, if unsure)     Negative test on 9/21 2. ONSET: "When did the COVID-19 symptoms start?"      01/29/19 3. WORST SYMPTOM: "What is your worst symptom?" (e.g., cough, fever, shortness of  breath, muscle aches)     Fever since Monday night 4. COUGH: "Do you have a cough?" If so, ask: "How bad is the cough?"       Pretty bad when lying down 5. FEVER: "Do you have a fever?" If so, ask: "What is your temperature, how was it measured, and when did it start?"    Yes 103.2 the highest today 6. RESPIRATORY STATUS: "Describe your breathing?" (e.g., shortness of breath, wheezing, unable to speak)     No wheezing, catch breath when coughing 7. BETTER-SAME-WORSE: "Are you getting better, staying the same or getting worse compared to yesterday?"  If getting worse, ask, "In what way?"      A little better than yesterday 8. HIGH RISK DISEASE: "Do you have any chronic medical problems?" (e.g., asthma, heart or lung disease, weak immune system, etc.)     No 9. PREGNANCY: "Is there any chance you are pregnant?" "When was your last menstrual period?"     No 10. OTHER SYMPTOMS: "Do you have any other symptoms?"  (e.g., chills, fatigue, headache, loss of smell or taste, muscle pain, sore throat)      Headache, dizziness, chills, loss of smell or taste  Protocols used: CORONAVIRUS (COVID-19) DIAGNOSED OR SUSPECTED-A-AH, CORONAVIRUS (COVID-19) EXPOSURE-A-AH

## 2019-02-05 NOTE — Telephone Encounter (Signed)
This encounter was created in error - please disregard.

## 2019-02-13 ENCOUNTER — Other Ambulatory Visit: Payer: Self-pay

## 2019-02-13 DIAGNOSIS — Z20822 Contact with and (suspected) exposure to covid-19: Secondary | ICD-10-CM

## 2019-02-15 LAB — NOVEL CORONAVIRUS, NAA: SARS-CoV-2, NAA: DETECTED — AB

## 2019-02-19 ENCOUNTER — Telehealth: Payer: Self-pay | Admitting: *Deleted

## 2019-02-19 ENCOUNTER — Other Ambulatory Visit: Payer: Self-pay

## 2019-02-19 DIAGNOSIS — Z20822 Contact with and (suspected) exposure to covid-19: Secondary | ICD-10-CM

## 2019-02-19 NOTE — Telephone Encounter (Signed)
Called pt regarding her positive covid-19 test result. She viewed her result on MyChart. She stated she had come in contact with her sister that was not having symptoms but tested positive for the virus. The patient was tested 3 weeks ago and started having symptoms later, so she go tested on 10/08 with positive results.  She had been in quarantine for the last 3 weeks.  No symptoms now. Feels she needs to be tested because she is taking care of her children and to go back to work. Health department had been notified.

## 2019-02-20 LAB — NOVEL CORONAVIRUS, NAA: SARS-CoV-2, NAA: NOT DETECTED
